# Patient Record
Sex: Male | Born: 1980 | Race: White | Hispanic: No | Marital: Single | Smoking: Never smoker
Health system: Southern US, Community
[De-identification: ages and names within clinical notes are randomized; demographics above are authoritative.]

## PROBLEM LIST (undated history)

## (undated) DIAGNOSIS — S3992XA Unspecified injury of lower back, initial encounter: Secondary | ICD-10-CM

## (undated) DIAGNOSIS — K920 Hematemesis: Secondary | ICD-10-CM

## (undated) DIAGNOSIS — M549 Dorsalgia, unspecified: Secondary | ICD-10-CM

## (undated) DIAGNOSIS — G5603 Carpal tunnel syndrome, bilateral upper limbs: Secondary | ICD-10-CM

## (undated) HISTORY — PX: LINGUAL FRENECTOMY: SHX6357

## (undated) HISTORY — DX: Carpal tunnel syndrome, bilateral upper limbs: G56.03

## (undated) HISTORY — DX: Hematemesis: K92.0

## (undated) HISTORY — DX: Unspecified injury of lower back, initial encounter: S39.92XA

---

## 2003-01-22 ENCOUNTER — Emergency Department (HOSPITAL_COMMUNITY): Admission: EM | Admit: 2003-01-22 | Discharge: 2003-01-23 | Payer: Self-pay | Admitting: *Deleted

## 2003-01-22 ENCOUNTER — Encounter: Payer: Self-pay | Admitting: *Deleted

## 2003-05-20 ENCOUNTER — Encounter: Payer: Self-pay | Admitting: Emergency Medicine

## 2003-05-20 ENCOUNTER — Emergency Department (HOSPITAL_COMMUNITY): Admission: EM | Admit: 2003-05-20 | Discharge: 2003-05-20 | Payer: Self-pay | Admitting: Emergency Medicine

## 2003-05-22 ENCOUNTER — Emergency Department (HOSPITAL_COMMUNITY): Admission: EM | Admit: 2003-05-22 | Discharge: 2003-05-22 | Payer: Self-pay | Admitting: Emergency Medicine

## 2004-06-24 ENCOUNTER — Ambulatory Visit (HOSPITAL_COMMUNITY): Admission: RE | Admit: 2004-06-24 | Discharge: 2004-06-24 | Payer: Self-pay | Admitting: Family Medicine

## 2008-02-27 ENCOUNTER — Emergency Department (HOSPITAL_COMMUNITY): Admission: EM | Admit: 2008-02-27 | Discharge: 2008-02-27 | Payer: Self-pay | Admitting: Emergency Medicine

## 2008-11-26 ENCOUNTER — Emergency Department (HOSPITAL_COMMUNITY): Admission: EM | Admit: 2008-11-26 | Discharge: 2008-11-26 | Payer: Self-pay | Admitting: Emergency Medicine

## 2009-07-09 ENCOUNTER — Emergency Department (HOSPITAL_COMMUNITY): Admission: EM | Admit: 2009-07-09 | Discharge: 2009-07-09 | Payer: Self-pay | Admitting: Emergency Medicine

## 2010-09-29 DIAGNOSIS — G5603 Carpal tunnel syndrome, bilateral upper limbs: Secondary | ICD-10-CM

## 2010-09-29 HISTORY — DX: Carpal tunnel syndrome, bilateral upper limbs: G56.03

## 2011-03-12 ENCOUNTER — Emergency Department (HOSPITAL_COMMUNITY)
Admission: EM | Admit: 2011-03-12 | Discharge: 2011-03-12 | Disposition: A | Discharge status: Home / Self Care | Payer: BC Managed Care – PPO | Attending: Emergency Medicine | Admitting: Emergency Medicine

## 2011-03-12 DIAGNOSIS — G5600 Carpal tunnel syndrome, unspecified upper limb: Secondary | ICD-10-CM | POA: Insufficient documentation

## 2011-03-12 DIAGNOSIS — M25539 Pain in unspecified wrist: Secondary | ICD-10-CM | POA: Insufficient documentation

## 2011-03-30 ENCOUNTER — Emergency Department (HOSPITAL_COMMUNITY)
Admission: EM | Admit: 2011-03-30 | Discharge: 2011-03-30 | Disposition: A | Discharge status: Home / Self Care | Payer: BC Managed Care – PPO | Attending: Emergency Medicine | Admitting: Emergency Medicine

## 2011-03-30 DIAGNOSIS — K089 Disorder of teeth and supporting structures, unspecified: Secondary | ICD-10-CM | POA: Insufficient documentation

## 2012-06-24 ENCOUNTER — Encounter (HOSPITAL_COMMUNITY): Payer: Self-pay | Admitting: Emergency Medicine

## 2012-06-24 ENCOUNTER — Emergency Department (HOSPITAL_COMMUNITY)
Admission: EM | Admit: 2012-06-24 | Discharge: 2012-06-24 | Disposition: A | Discharge status: Home / Self Care | Payer: Self-pay | Attending: Emergency Medicine | Admitting: Emergency Medicine

## 2012-06-24 DIAGNOSIS — F17200 Nicotine dependence, unspecified, uncomplicated: Secondary | ICD-10-CM | POA: Insufficient documentation

## 2012-06-24 DIAGNOSIS — J029 Acute pharyngitis, unspecified: Secondary | ICD-10-CM | POA: Insufficient documentation

## 2012-06-24 LAB — RAPID STREP SCREEN (MED CTR MEBANE ONLY): Streptococcus, Group A Screen (Direct): NEGATIVE

## 2012-06-24 MED ORDER — PENICILLIN V POTASSIUM 500 MG PO TABS: 500.0000 mg | ORAL_TABLET | Freq: Four times a day (QID) | ORAL | Status: AC

## 2012-06-24 NOTE — ED Notes (Signed)
dischagre instructions reviewed

## 2012-06-24 NOTE — ED Notes (Signed)
Pt c/o sore throat since Monday.

## 2012-06-24 NOTE — ED Provider Notes (Signed)
History     CSN: [id number]  Arrival date & time 06/24/12  0901   First MD Initiated Contact with Patient 06/24/12 [phone]      Chief Complaint  Patient presents with  . Sore Throat    (Consider location/radiation/quality/duration/timing/severity/associated sxs/prior treatment) HPI Comments: + subjective fever.  + chills.  Patient is a 31 y.o. male presenting with pharyngitis. The history is provided by the patient. No language interpreter was used.  Sore Throat This is a new problem. Episode onset: 3 days ago. The problem occurs constantly. Associated symptoms include a fever, headaches, myalgias, a sore throat and swollen glands. Pertinent negatives include no chills or coughing. Treatments tried: "cold medicines". The treatment provided no relief.    History reviewed. No pertinent past medical history.  History reviewed. No pertinent past surgical history.  No family history on file.  History  Substance Use Topics  . Smoking status: Current Every Day Smoker    Types: Cigarettes  . Smokeless tobacco: Not on file  . Alcohol Use: Yes      Review of Systems  Constitutional: Positive for fever. Negative for chills.  HENT: Positive for sore throat.   Respiratory: Negative for cough.   Musculoskeletal: Positive for myalgias.  Neurological: Positive for headaches.  All other systems reviewed and are negative.    Allergies  Erythromycin  Home Medications   Current Outpatient Rx  Name Route Sig Dispense Refill  . PENICILLIN V POTASSIUM 500 MG PO TABS Oral Take 1 tablet (500 mg total) by mouth 4 (four) times daily. 40 tablet 0    BP 156/98  Pulse 97  Temp 99 F (37.2 C) (Oral)  Resp 18  SpO2 98%  Physical Exam  Nursing note and vitals reviewed. Constitutional: He is oriented to person, place, and time. He appears well-developed and well-nourished.  HENT:  Head: Normocephalic and atraumatic. No trismus in the jaw.  Mouth/Throat: Uvula is midline and mucous  membranes are normal. No dental abscesses or uvula swelling. Oropharyngeal exudate and posterior oropharyngeal erythema present. No posterior oropharyngeal edema or tonsillar abscesses.  Eyes: EOM are normal.  Neck: Normal range of motion.  Cardiovascular: Normal rate, regular rhythm, normal heart sounds and intact distal pulses.   Pulmonary/Chest: Effort normal and breath sounds normal. No respiratory distress.  Abdominal: Soft. He exhibits no distension. There is no tenderness.  Musculoskeletal: Normal range of motion.  Lymphadenopathy:    He has cervical adenopathy.       Right cervical: Superficial cervical and deep cervical adenopathy present. No posterior cervical adenopathy present.      Left cervical: Superficial cervical and deep cervical adenopathy present. No posterior cervical adenopathy present.  Neurological: He is alert and oriented to person, place, and time.  Skin: Skin is warm and dry.  Psychiatric: He has a normal mood and affect. Judgment normal.    ED Course  Procedures (including critical care time)   Labs Reviewed  RAPID STREP SCREEN   No results found.   1. Pharyngitis       MDM  rx-pen VK, 40 Ibuprofen Salt water gargles Chloraseptic. F/u with PCP prn        Evalina Field, PA 06/24/12 1000

## 2012-06-26 NOTE — ED Provider Notes (Signed)
Medical screening examination/treatment/procedure(s) were performed by non-physician practitioner and as supervising physician I was immediately available for consultation/collaboration.   Carleene Cooper III, MD 06/26/12 [phone]

## 2012-10-16 ENCOUNTER — Emergency Department (HOSPITAL_COMMUNITY): Payer: Self-pay

## 2012-10-16 ENCOUNTER — Encounter (HOSPITAL_COMMUNITY): Payer: Self-pay | Admitting: *Deleted

## 2012-10-16 ENCOUNTER — Emergency Department (HOSPITAL_COMMUNITY)
Admission: EM | Admit: 2012-10-16 | Discharge: 2012-10-16 | Disposition: A | Discharge status: Home / Self Care | Payer: Self-pay | Attending: Emergency Medicine | Admitting: Emergency Medicine

## 2012-10-16 DIAGNOSIS — M2550 Pain in unspecified joint: Secondary | ICD-10-CM | POA: Insufficient documentation

## 2012-10-16 DIAGNOSIS — M79671 Pain in right foot: Secondary | ICD-10-CM

## 2012-10-16 DIAGNOSIS — M79609 Pain in unspecified limb: Secondary | ICD-10-CM | POA: Insufficient documentation

## 2012-10-16 DIAGNOSIS — F17200 Nicotine dependence, unspecified, uncomplicated: Secondary | ICD-10-CM | POA: Insufficient documentation

## 2012-10-16 MED ORDER — ACETAMINOPHEN 500 MG PO TABS
1000.0000 mg | ORAL_TABLET | Freq: Once | ORAL | Status: AC
  Administered 2012-10-16: 1000 mg via ORAL
  Filled 2012-10-16: qty 2

## 2012-10-16 MED ORDER — IBUPROFEN 800 MG PO TABS
800.0000 mg | ORAL_TABLET | Freq: Once | ORAL | Status: AC
  Administered 2012-10-16: 800 mg via ORAL
  Filled 2012-10-16: qty 1

## 2012-10-16 NOTE — ED Provider Notes (Signed)
Medical screening examination/treatment/procedure(s) were performed by non-physician practitioner and as supervising physician I was immediately available for consultation/collaboration.  Daana Petrasek, MD 10/16/12 2239 

## 2012-10-16 NOTE — ED Notes (Signed)
Pt c/o right foot pain

## 2012-10-16 NOTE — ED Provider Notes (Signed)
History     CSN: [id number]  Arrival date & time 10/16/12  2106   First MD Initiated Contact with Patient 10/16/12 2140      Chief Complaint  Patient presents with  . Foot Pain    (Consider location/radiation/quality/duration/timing/severity/associated sxs/prior treatment) Patient is a 32 y.o. male presenting with lower extremity pain. The history is provided by the patient.  Foot Pain This is a new problem. The current episode started in the past 7 days. The problem occurs constantly. The problem has been unchanged. Associated symptoms include arthralgias. Pertinent negatives include no abdominal pain, chest pain, coughing, joint swelling or neck pain. The symptoms are aggravated by standing, walking and bending. He has tried acetaminophen for the symptoms. The treatment provided no relief.    History reviewed. No pertinent past medical history.  History reviewed. No pertinent past surgical history.  History reviewed. No pertinent family history.  History  Substance Use Topics  . Smoking status: Current Every Day Smoker    Types: Cigarettes  . Smokeless tobacco: Not on file  . Alcohol Use: Yes      Review of Systems  Constitutional: Negative for activity change.       All ROS Neg except as noted in HPI  HENT: Negative for nosebleeds and neck pain.   Eyes: Negative for photophobia and discharge.  Respiratory: Negative for cough, shortness of breath and wheezing.   Cardiovascular: Negative for chest pain and palpitations.  Gastrointestinal: Negative for abdominal pain and blood in stool.  Genitourinary: Negative for dysuria, frequency and hematuria.  Musculoskeletal: Positive for arthralgias. Negative for back pain and joint swelling.  Skin: Negative.   Neurological: Negative for dizziness, seizures and speech difficulty.  Psychiatric/Behavioral: Negative for hallucinations and confusion.    Allergies  Erythromycin  Home Medications  No current outpatient  prescriptions on file.  BP 150/81  Pulse 88  Temp 98.2 F (36.8 C) (Oral)  Resp 20  Ht 5\' 1"  (1.549 m)  Wt 197 lb (89.359 kg)  BMI 37.22 kg/m2  SpO2 100%  Physical Exam  Nursing note and vitals reviewed. Constitutional: He is oriented to person, place, and time. He appears well-developed and well-nourished.  Non-toxic appearance.  HENT:  Head: Normocephalic.  Right Ear: Tympanic membrane and external ear normal.  Left Ear: Tympanic membrane and external ear normal.  Eyes: EOM and lids are normal. Pupils are equal, round, and reactive to light.  Neck: Normal range of motion. Neck supple. Carotid bruit is not present.  Cardiovascular: Normal rate, regular rhythm, normal heart sounds, intact distal pulses and normal pulses.   Pulmonary/Chest: Breath sounds normal. No respiratory distress.  Abdominal: Soft. Bowel sounds are normal. There is no tenderness. There is no guarding.  Musculoskeletal: Normal range of motion.       There is full range of motion of the right hip knee and ankle. The Achilles tendon is intact on the right. The dorsalis pedis and posterior tibial pulses are 2+. There is full range of motion of the toes. There no lesions between the toes. There is soreness over the fourth and fifth metatarsal area. No increased redness or red streaking noted.  Lymphadenopathy:       Head (right side): No submandibular adenopathy present.       Head (left side): No submandibular adenopathy present.    He has no cervical adenopathy.  Neurological: He is alert and oriented to person, place, and time. He has normal strength. No cranial nerve deficit or sensory deficit.  Skin: Skin is warm and dry.  Psychiatric: He has a normal mood and affect. His speech is normal.    ED Course  Procedures (including critical care time)  Labs Reviewed - No data to display Dg Foot Complete Right  10/16/2012  *RADIOLOGY REPORT*  Clinical Data: Right foot pain to the base of the fifth toe and lateral  foot after injury 1 week ago.  RIGHT FOOT COMPLETE - 3+ VIEW  Comparison: None.  Findings: The right foot appears intact. No evidence of acute fracture or subluxation.  No focal bone lesions.  Bone matrix and cortex appear intact.  No abnormal radiopaque densities in the soft tissues.  IMPRESSION: No acute bony abnormalities.   Original Report Authenticated By: Burman Nieves, M.D.    Pulse oximetry 100% on room air. Within normal limits by my interpretation.  1. Foot pain, right       MDM  I have reviewed nursing notes, vital signs, and all appropriate lab and imaging results for this patient. Patient has a history of a metatarsal head fractures several years ago. Recently he has been standing. Flexing the toes on the foot, and stepping on and off step ladders while moving. He presents tonight to find out if he has been some injury to the previous fracture or sustained a new fracture.  The x-ray of the foot is negative for fracture or dislocation. There is no gas or free air noted. The examination reveals some soreness over the fourth and fifth metatarsal area but no other changes appreciated. The plan at this time is for the patient to use ibuprofen every 6 hours, he may use Tylenol Extra Strength in between the 6 hour.Marland Kitchen He is to see the podiatrist for additional evaluation if not improving.       Kathie Dike, [place] 10/16/12 2208

## 2013-08-13 ENCOUNTER — Encounter (HOSPITAL_COMMUNITY): Payer: Self-pay | Admitting: Emergency Medicine

## 2013-08-13 ENCOUNTER — Emergency Department (HOSPITAL_COMMUNITY)
Admission: EM | Admit: 2013-08-13 | Discharge: 2013-08-13 | Disposition: A | Discharge status: Home / Self Care | Payer: Self-pay | Attending: Emergency Medicine | Admitting: Emergency Medicine

## 2013-08-13 DIAGNOSIS — M25539 Pain in unspecified wrist: Secondary | ICD-10-CM

## 2013-08-13 DIAGNOSIS — G5600 Carpal tunnel syndrome, unspecified upper limb: Secondary | ICD-10-CM | POA: Insufficient documentation

## 2013-08-13 DIAGNOSIS — F17200 Nicotine dependence, unspecified, uncomplicated: Secondary | ICD-10-CM | POA: Insufficient documentation

## 2013-08-13 MED ORDER — IBUPROFEN 800 MG PO TABS
800.0000 mg | ORAL_TABLET | Freq: Once | ORAL | Status: AC
Start: 2013-08-13 — End: 2013-08-13
  Administered 2013-08-13: 800 mg via ORAL
  Filled 2013-08-13: qty 1

## 2013-08-13 MED ORDER — METHYLPREDNISOLONE 4 MG PO KIT: PACK | ORAL | Status: DC

## 2013-08-13 MED ORDER — HYDROMORPHONE HCL PF 1 MG/ML IJ SOLN
1.0000 mg | Freq: Once | INTRAMUSCULAR | Status: AC
  Administered 2013-08-13: 1 mg via INTRAMUSCULAR
  Filled 2013-08-13: qty 1

## 2013-08-13 NOTE — ED Notes (Signed)
Both hands and wrists hurt, I have carpal tunnel and tendonitis in both and I don't know if I have just irritated them or not per pt.

## 2013-08-13 NOTE — ED Provider Notes (Signed)
CSN: [id number]     Arrival date & time 08/13/13  0100 History   First MD Initiated Contact with Patient 08/13/13 0229     Chief Complaint  Patient presents with  . Hand Pain  . Wrist Pain   (Consider location/radiation/quality/duration/timing/severity/associated sxs/prior Treatment) HPI History provided by patient. 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 He has a history of carpal tunnel previously followed by orthopedics. He has a history of steroid injections that helped for a few months. He had been doing well until starting this new job. He presents tonight with few days of increasing wrist pain worse with movement. No swelling. Has some intermittent tingling denies any weakness. Tonight pain associated he couldn't sleep, requesting narcotics.   Past Medical History  Diagnosis Date  . Carpal tunnel syndrome on both sides    History reviewed. No pertinent past surgical history. History reviewed. No pertinent family history. History  Substance Use Topics  . Smoking status: Current Every Day Smoker    Types: Cigarettes  . Smokeless tobacco: Not on file  . Alcohol Use: Yes    Review of Systems  Constitutional: Negative for fever and chills.  Respiratory: Negative for shortness of breath.   Cardiovascular: Negative for chest pain.  Gastrointestinal: Negative for abdominal pain.  Musculoskeletal: Negative for back pain, neck pain and neck stiffness.  Skin: Negative for rash.  Neurological: Negative for weakness.  All other systems reviewed and are negative.    Allergies  Erythromycin  Home Medications  No current outpatient prescriptions on file. BP 127/72  Pulse 67  Temp(Src) 98.7 F (37.1 C) (Oral)  Resp 20  Ht 5\' 6"  (1.676 m)  Wt 200 lb (90.719 kg)  BMI 32.30 kg/m2  SpO2 98% Physical Exam  Constitutional: He is oriented to person, place, and time. He appears well-developed and well-nourished.  HENT:  Head: Normocephalic and atraumatic.  Eyes: EOM are  normal. Pupils are equal, round, and reactive to light.  Neck: Neck supple.  Cardiovascular: Regular rhythm and intact distal pulses.   Pulmonary/Chest: Effort normal. No respiratory distress.  Musculoskeletal: Normal range of motion. He exhibits no edema.  Positive Tinel's bilaterally. Wrist without swelling or erythema or increased warmth to touch. Good range of motion throughout the hands and wrists. Distal neurovascular intact. Equal grips, triceps, biceps and motor strength and hands/ digits  Neurological: He is alert and oriented to person, place, and time.  Skin: Skin is warm and dry.    ED Course  Procedures (including critical care time) Labs Review Labs Reviewed - No data to display Imaging Review No results found.  EKG Interpretation   None      Motrin, dilaudid, splints provided, plan f/u Ortho  B Hand pain h/o carpal tunnel - patient reports her discharge home and outpatient followup. Prescription provided. Return precautions verbalized as understood  MDM  Diagnosis: Carpal tunnel syndrome  Medications provided Sling provided Orthopedic referral   Sunnie Nielsen, MD 08/14/13 [phone]

## 2013-09-29 DIAGNOSIS — S3992XA Unspecified injury of lower back, initial encounter: Secondary | ICD-10-CM

## 2013-09-29 HISTORY — DX: Unspecified injury of lower back, initial encounter: S39.92XA

## 2013-10-02 ENCOUNTER — Encounter (HOSPITAL_COMMUNITY): Payer: Self-pay | Admitting: Emergency Medicine

## 2013-10-02 ENCOUNTER — Emergency Department (HOSPITAL_COMMUNITY): Payer: Self-pay

## 2013-10-02 ENCOUNTER — Emergency Department (HOSPITAL_COMMUNITY)
Admission: EM | Admit: 2013-10-02 | Discharge: 2013-10-02 | Disposition: A | Discharge status: Home / Self Care | Payer: Self-pay | Attending: Emergency Medicine | Admitting: Emergency Medicine

## 2013-10-02 DIAGNOSIS — R61 Generalized hyperhidrosis: Secondary | ICD-10-CM | POA: Insufficient documentation

## 2013-10-02 DIAGNOSIS — R0789 Other chest pain: Secondary | ICD-10-CM

## 2013-10-02 DIAGNOSIS — R0602 Shortness of breath: Secondary | ICD-10-CM | POA: Insufficient documentation

## 2013-10-02 DIAGNOSIS — Z8669 Personal history of other diseases of the nervous system and sense organs: Secondary | ICD-10-CM | POA: Insufficient documentation

## 2013-10-02 DIAGNOSIS — R071 Chest pain on breathing: Secondary | ICD-10-CM | POA: Insufficient documentation

## 2013-10-02 LAB — CBC
HEMATOCRIT: 43.3 % (ref 39.0–52.0)
HEMOGLOBIN: 15 g/dL (ref 13.0–17.0)
MCH: 29.2 pg (ref 26.0–34.0)
MCHC: 34.6 g/dL (ref 30.0–36.0)
MCV: 84.4 fL (ref 78.0–100.0)
Platelets: 214 10*3/uL (ref 150–400)
RBC: 5.13 MIL/uL (ref 4.22–5.81)
RDW: 12.4 % (ref 11.5–15.5)
WBC: 5.6 10*3/uL (ref 4.0–10.5)

## 2013-10-02 LAB — BASIC METABOLIC PANEL
BUN: 10 mg/dL (ref 6–23)
CHLORIDE: 104 meq/L (ref 96–112)
CO2: 28 mEq/L (ref 19–32)
Calcium: 9.2 mg/dL (ref 8.4–10.5)
Creatinine, Ser: 0.92 mg/dL (ref 0.50–1.35)
GFR calc non Af Amer: >90 mL/min (ref >90)
Glucose, Bld: 108 mg/dL — ABNORMAL HIGH (ref 70–99)
POTASSIUM: 4.4 meq/L (ref 3.7–5.3)
Sodium: 141 mEq/L (ref 137–147)

## 2013-10-02 LAB — D-DIMER, QUANTITATIVE: D-Dimer, Quant: <0.27 ug/mL-FEU (ref 0.00–0.48)

## 2013-10-02 LAB — TROPONIN I: Troponin I: <0.3 ng/mL (ref <0.30)

## 2013-10-02 MED ORDER — OXYCODONE-ACETAMINOPHEN 5-325 MG PO TABS
1.0000 | ORAL_TABLET | Freq: Once | ORAL | Status: AC
  Administered 2013-10-02: 1 via ORAL
  Filled 2013-10-02: qty 1

## 2013-10-02 MED ORDER — OXYCODONE-ACETAMINOPHEN 5-325 MG PO TABS: 1.0000 | ORAL_TABLET | Freq: Four times a day (QID) | ORAL | Status: DC | PRN

## 2013-10-02 NOTE — ED Provider Notes (Signed)
CSN: [id number]     Arrival date & time 10/02/13  1210 History  This chart was scribed for [employer]. Rubin Payor, MD by Bennett Scrape, ED Scribe. This patient was seen in room APA11/[facility]11 and the patient's care was started at 2:38 PM.   Chief Complaint  Patient presents with  . Chest Pain    The history is provided by the patient. No language interpreter was used.    HPI Comments: Joel Benton is a 33 y.o. male who presents to the Emergency Department complaining of sudden onset right-sided CP located "beneath my breast" that he noted today while at rest. He states that the pain was constant with the original onset for one hour but states that now it is an intermittent stabbing pain felt with deep breathing. He denies any recent traumas or falls. He states that he did experience diaphoresis and SOB during the episode which caused him concern but those symptoms have both resolved. He states that he has experienced prior episodes as a child but none this severe. He reports that he did notice left-sided leg swelling that was present 2 to 3 days ago but reports that it resolved. There is a family h/o DVT with his mother and grandmother. He denies having a h/o CA or being a smoker.   Past Medical History  Diagnosis Date  . Carpal tunnel syndrome on both sides    History reviewed. No pertinent past surgical history. No family history on file. History  Substance Use Topics  . Smoking status: Never Smoker   . Smokeless tobacco: Not on file  . Alcohol Use: Yes     Comment: Occ    Review of Systems  Constitutional: Positive for diaphoresis.  Respiratory: Positive for shortness of breath.   Cardiovascular: Positive for chest pain.  All other systems reviewed and are negative.    Allergies  Erythromycin  Home Medications   Current Outpatient Rx  Name  Route  Sig  Dispense  Refill  . oxyCODONE-acetaminophen (PERCOCET/ROXICET) 5-325 MG per tablet   Oral   Take 1-2 tablets by mouth every  6 (six) hours as needed for severe pain.   10 tablet   0    Triage Vitals: BP 122/69  Pulse 70  Temp(Src) 98.8 F (37.1 C) (Oral)  Resp 24  Ht 5\' 6"  (1.676 m)  Wt 197 lb (89.359 kg)  BMI 31.81 kg/m2  SpO2 100%  Physical Exam  Nursing note and vitals reviewed. Constitutional: He is oriented to person, place, and time. He appears well-developed and well-nourished. No distress.  HENT:  Head: Normocephalic and atraumatic.  Eyes: EOM are normal.  Neck: Neck supple. No tracheal deviation present.  Cardiovascular: Normal rate and regular rhythm.   Pulmonary/Chest: Effort normal and breath sounds normal. No respiratory distress. He exhibits tenderness (right anterior mid to lateral chest wall).  Abdominal: Soft. There is no tenderness.  Musculoskeletal: Normal range of motion. He exhibits no edema (no peripheral edema ).  Neurological: He is alert and oriented to person, place, and time.  Skin: Skin is warm and dry.  Psychiatric: He has a normal mood and affect. His behavior is normal.    ED Course  Procedures (including critical care time)  DIAGNOSTIC STUDIES: Oxygen Saturation is 100% on RA, normal by my interpretation.    COORDINATION OF CARE: 2:41 PM-Informed pt that work up thus far is less concerning for cardiac issues. Discussed treatment plan which includes D-dimer with pt at bedside and pt agreed to  plan.   Labs Review Labs Reviewed  BASIC METABOLIC PANEL - Abnormal; Notable for the following:    Glucose, Bld 108 (*)    All other components within normal limits  CBC  TROPONIN I  D-DIMER, QUANTITATIVE   Imaging Review Dg Chest 2 View  10/02/2013   CLINICAL DATA:  Substernal chest pain 1 hr. Nausea and shortness of breath.  EXAM: CHEST  2 VIEW  COMPARISON:  06/22/2004  FINDINGS: Lungs are hypoinflated without consolidation or effusion. The cardiomediastinal silhouette and remainder of the exam is unchanged.  IMPRESSION: No active cardiopulmonary disease.    Electronically Signed   By: Elberta Fortisaniel  Boyle M.D.   On: 10/02/2013 12:47    EKG Interpretation    Date/Time:  Sunday October 02 2013 12:15:49 EST Ventricular Rate:  78 PR Interval:  142 QRS Duration: 104 QT Interval:  374 QTC Calculation: 426 R Axis:   27 Text Interpretation:  Normal sinus rhythm Normal ECG No previous ECGs available Confirmed by Kadesia Robel  MD, Jance Siek ([id number]) on 10/02/2013 2:47:23 PM            MDM   1. Chest wall pain    Patient with acute onset right chest pain. he's point tender on the anterior chest. EKG reassuring.Negative x-ray. Doubt cardiac cause. Patient's mother and grandmother have both had DVTs. Negative d-dimer for the patient. Will discharge home with pain medicines  I personally performed the services described in this documentation, which was scribed in my presence. The recorded information has been reviewed and is accurate.     Juliet RudeNathan R. Rubin PayorPickering, MD 10/02/13 [phone]

## 2013-10-02 NOTE — ED Notes (Signed)
Pt states right lower chest pain began 1 hour PTA. States "It was hard to breathe for a few minutes" Also states nausea. NAD at this time. Only complaint at this time is pain to right chest.

## 2013-10-02 NOTE — Discharge Instructions (Signed)

## 2014-08-14 ENCOUNTER — Encounter (HOSPITAL_COMMUNITY): Payer: Self-pay | Admitting: *Deleted

## 2014-08-14 ENCOUNTER — Emergency Department (HOSPITAL_COMMUNITY): Payer: Self-pay

## 2014-08-14 ENCOUNTER — Emergency Department (HOSPITAL_COMMUNITY)
Admission: EM | Admit: 2014-08-14 | Discharge: 2014-08-14 | Disposition: A | Discharge status: Home / Self Care | Payer: Self-pay | Attending: Emergency Medicine | Admitting: Emergency Medicine

## 2014-08-14 DIAGNOSIS — Y929 Unspecified place or not applicable: Secondary | ICD-10-CM | POA: Insufficient documentation

## 2014-08-14 DIAGNOSIS — S3992XA Unspecified injury of lower back, initial encounter: Secondary | ICD-10-CM | POA: Insufficient documentation

## 2014-08-14 DIAGNOSIS — Z8669 Personal history of other diseases of the nervous system and sense organs: Secondary | ICD-10-CM | POA: Insufficient documentation

## 2014-08-14 DIAGNOSIS — K2100 Gastro-esophageal reflux disease with esophagitis, without bleeding: Secondary | ICD-10-CM

## 2014-08-14 DIAGNOSIS — M545 Low back pain, unspecified: Secondary | ICD-10-CM

## 2014-08-14 DIAGNOSIS — W19XXXA Unspecified fall, initial encounter: Secondary | ICD-10-CM

## 2014-08-14 DIAGNOSIS — W1839XA Other fall on same level, initial encounter: Secondary | ICD-10-CM | POA: Insufficient documentation

## 2014-08-14 DIAGNOSIS — Y998 Other external cause status: Secondary | ICD-10-CM | POA: Insufficient documentation

## 2014-08-14 DIAGNOSIS — Y939 Activity, unspecified: Secondary | ICD-10-CM | POA: Insufficient documentation

## 2014-08-14 LAB — CBC WITH DIFFERENTIAL/PLATELET
BASOS ABS: 0 10*3/uL (ref 0.0–0.1)
Basophils Relative: 0 % (ref 0–1)
Eosinophils Absolute: 0.1 10*3/uL (ref 0.0–0.7)
Eosinophils Relative: 2 % (ref 0–5)
HEMATOCRIT: 45 % (ref 39.0–52.0)
Hemoglobin: 15.6 g/dL (ref 13.0–17.0)
LYMPHS PCT: 24 % (ref 12–46)
Lymphs Abs: 1.4 10*3/uL (ref 0.7–4.0)
MCH: 29.7 pg (ref 26.0–34.0)
MCHC: 34.7 g/dL (ref 30.0–36.0)
MCV: 85.7 fL (ref 78.0–100.0)
Monocytes Absolute: 0.4 10*3/uL (ref 0.1–1.0)
Monocytes Relative: 7 % (ref 3–12)
NEUTROS ABS: 4 10*3/uL (ref 1.7–7.7)
NEUTROS PCT: 67 % (ref 43–77)
PLATELETS: 235 10*3/uL (ref 150–400)
RBC: 5.25 MIL/uL (ref 4.22–5.81)
RDW: 13 % (ref 11.5–15.5)
WBC: 5.9 10*3/uL (ref 4.0–10.5)

## 2014-08-14 LAB — URINALYSIS, ROUTINE W REFLEX MICROSCOPIC
Bilirubin Urine: NEGATIVE
GLUCOSE, UA: NEGATIVE mg/dL
Hgb urine dipstick: NEGATIVE
Ketones, ur: NEGATIVE mg/dL
LEUKOCYTES UA: NEGATIVE
NITRITE: NEGATIVE
PROTEIN: NEGATIVE mg/dL
Specific Gravity, Urine: 1.015 (ref 1.005–1.030)
UROBILINOGEN UA: 0.2 mg/dL (ref 0.0–1.0)
pH: 7 (ref 5.0–8.0)

## 2014-08-14 LAB — COMPREHENSIVE METABOLIC PANEL
ALT: 53 U/L (ref 0–53)
AST: 30 U/L (ref 0–37)
Albumin: 4.4 g/dL (ref 3.5–5.2)
Alkaline Phosphatase: 60 U/L (ref 39–117)
Anion gap: 10 (ref 5–15)
BILIRUBIN TOTAL: 0.8 mg/dL (ref 0.3–1.2)
BUN: 9 mg/dL (ref 6–23)
CHLORIDE: 101 meq/L (ref 96–112)
CO2: 29 meq/L (ref 19–32)
Calcium: 9.4 mg/dL (ref 8.4–10.5)
Creatinine, Ser: 1.1 mg/dL (ref 0.50–1.35)
GFR calc Af Amer: >90 mL/min (ref >90)
GFR, EST NON AFRICAN AMERICAN: 88 mL/min — AB (ref >90)
Glucose, Bld: 90 mg/dL (ref 70–99)
POTASSIUM: 4.1 meq/L (ref 3.7–5.3)
SODIUM: 140 meq/L (ref 137–147)
Total Protein: 7.4 g/dL (ref 6.0–8.3)

## 2014-08-14 MED ORDER — PANTOPRAZOLE SODIUM 40 MG PO TBEC
40.0000 mg | DELAYED_RELEASE_TABLET | Freq: Once | ORAL | Status: AC
  Administered 2014-08-14: 40 mg via ORAL
  Filled 2014-08-14: qty 1

## 2014-08-14 MED ORDER — OXYCODONE-ACETAMINOPHEN 5-325 MG PO TABS: 1.0000 | ORAL_TABLET | ORAL | Status: DC | PRN

## 2014-08-14 MED ORDER — OXYCODONE-ACETAMINOPHEN 5-325 MG PO TABS
1.0000 | ORAL_TABLET | Freq: Once | ORAL | Status: AC
  Administered 2014-08-14: 1 via ORAL
  Filled 2014-08-14: qty 1

## 2014-08-14 MED ORDER — OXYCODONE-ACETAMINOPHEN 5-325 MG PO TABS: ORAL_TABLET | ORAL | Status: DC

## 2014-08-14 MED ORDER — CYCLOBENZAPRINE HCL 10 MG PO TABS: 10.0000 mg | ORAL_TABLET | Freq: Three times a day (TID) | ORAL | Status: DC | PRN

## 2014-08-14 NOTE — ED Notes (Signed)
Pt has past hx of back injury and pain started back 8 days ago. Pain is in lower back but moves up pts spine. Pain is worse with movement.
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
## 2014-08-14 NOTE — ED Notes (Signed)
Pre pack of percocet given to pt   1 q4h

## 2014-08-14 NOTE — ED Notes (Signed)
Back pain for 8 days, Has hx of back pain, No relief with flexeril and tylenol.

## 2014-08-22 MED FILL — Oxycodone w/ Acetaminophen Tab 5-325 MG: ORAL | Qty: 6 | Status: AC

## 2014-10-09 ENCOUNTER — Emergency Department (HOSPITAL_COMMUNITY)
Admission: EM | Admit: 2014-10-09 | Discharge: 2014-10-10 | Disposition: A | Discharge status: Home / Self Care | Payer: Self-pay | Attending: Emergency Medicine | Admitting: Emergency Medicine

## 2014-10-09 ENCOUNTER — Encounter (HOSPITAL_COMMUNITY): Payer: Self-pay | Admitting: *Deleted

## 2014-10-09 DIAGNOSIS — Z8669 Personal history of other diseases of the nervous system and sense organs: Secondary | ICD-10-CM | POA: Insufficient documentation

## 2014-10-09 DIAGNOSIS — M545 Low back pain, unspecified: Secondary | ICD-10-CM

## 2014-10-09 DIAGNOSIS — Z87828 Personal history of other (healed) physical injury and trauma: Secondary | ICD-10-CM | POA: Insufficient documentation

## 2014-10-09 HISTORY — DX: Dorsalgia, unspecified: M54.9

## 2014-10-09 MED ORDER — CYCLOBENZAPRINE HCL 10 MG PO TABS
10.0000 mg | ORAL_TABLET | Freq: Once | ORAL | Status: AC
  Administered 2014-10-10: 10 mg via ORAL
  Filled 2014-10-09: qty 1

## 2014-10-09 MED ORDER — OXYCODONE-ACETAMINOPHEN 5-325 MG PO TABS
1.0000 | ORAL_TABLET | Freq: Once | ORAL | Status: AC
  Administered 2014-10-10: 1 via ORAL
  Filled 2014-10-09: qty 1

## 2014-10-09 MED ORDER — KETOROLAC TROMETHAMINE 60 MG/2ML IM SOLN
60.0000 mg | Freq: Once | INTRAMUSCULAR | Status: AC
  Administered 2014-10-10: 60 mg via INTRAMUSCULAR
  Filled 2014-10-09: qty 2

## 2014-10-09 NOTE — ED Notes (Signed)
Low back pain , for 4 days.
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
## 2014-10-09 NOTE — ED Notes (Signed)
Pt reports moving some wood & dog house about 4 days ago & aggravated his lower back again. Pt ambulated down hallway w/ no complications

## 2014-10-10 MED ORDER — IBUPROFEN 600 MG PO TABS: 600.0000 mg | ORAL_TABLET | Freq: Three times a day (TID) | ORAL | Status: DC | PRN

## 2014-10-10 MED ORDER — CYCLOBENZAPRINE HCL 10 MG PO TABS: 10.0000 mg | ORAL_TABLET | Freq: Three times a day (TID) | ORAL | Status: DC | PRN

## 2014-10-10 NOTE — ED Notes (Signed)
Pt alert & oriented x4, stable gait. Patient given discharge instructions, paperwork & prescription(s). Patient  instructed to stop at the registration desk to finish any additional paperwork. Patient verbalized understanding. Pt left department w/ no further questions. 

## 2015-01-29 ENCOUNTER — Encounter (HOSPITAL_COMMUNITY): Payer: Self-pay

## 2015-01-29 ENCOUNTER — Emergency Department (HOSPITAL_COMMUNITY)
Admission: EM | Admit: 2015-01-29 | Discharge: 2015-01-29 | Disposition: A | Discharge status: Home / Self Care | Payer: Self-pay | Attending: Emergency Medicine | Admitting: Emergency Medicine

## 2015-01-29 DIAGNOSIS — M545 Low back pain, unspecified: Secondary | ICD-10-CM

## 2015-01-29 DIAGNOSIS — Z8669 Personal history of other diseases of the nervous system and sense organs: Secondary | ICD-10-CM | POA: Insufficient documentation

## 2015-01-29 MED ORDER — DIAZEPAM 5 MG PO TABS
5.0000 mg | ORAL_TABLET | Freq: Once | ORAL | Status: AC
  Administered 2015-01-29: 5 mg via ORAL
  Filled 2015-01-29: qty 1

## 2015-01-29 MED ORDER — DIAZEPAM 5 MG PO TABS: 5.0000 mg | ORAL_TABLET | Freq: Three times a day (TID) | ORAL | Status: DC | PRN

## 2015-01-29 MED ORDER — PREDNISONE 10 MG PO TABS: ORAL_TABLET | ORAL | Status: DC

## 2015-01-29 MED ORDER — PREDNISONE 50 MG PO TABS
60.0000 mg | ORAL_TABLET | Freq: Once | ORAL | Status: AC
  Administered 2015-01-29: 60 mg via ORAL
  Filled 2015-01-29 (×2): qty 1

## 2015-01-29 MED ORDER — TRAMADOL HCL 50 MG PO TABS: 50.0000 mg | ORAL_TABLET | Freq: Four times a day (QID) | ORAL | Status: DC | PRN

## 2015-01-29 MED ORDER — TRAMADOL HCL 50 MG PO TABS
50.0000 mg | ORAL_TABLET | Freq: Once | ORAL | Status: AC
  Administered 2015-01-29: 50 mg via ORAL
  Filled 2015-01-29: qty 1

## 2015-01-29 NOTE — ED Notes (Signed)
PA Julie at bedside.  

## 2015-01-29 NOTE — Discharge Instructions (Signed)
Chronic Back Pain  When back pain lasts longer than 3 months, it is called chronic back pain.People with chronic back pain often go through certain periods that are more intense (flare-ups).  CAUSES Chronic back pain can be caused by wear and tear (degeneration) on different structures in your back. These structures include:  The bones of your spine (vertebrae) and the joints surrounding your spinal cord and nerve roots (facets).  The strong, fibrous tissues that connect your vertebrae (ligaments). Degeneration of these structures may result in pressure on your nerves. This can lead to constant pain. HOME CARE INSTRUCTIONS  Avoid bending, heavy lifting, prolonged sitting, and activities which make the problem worse.  Take brief periods of rest throughout the day to reduce your pain. Lying down or standing usually is better than sitting while you are resting.  Take over-the-counter or prescription medicines only as directed by your caregiver. SEEK IMMEDIATE MEDICAL CARE IF:   You have weakness or numbness in one of your legs or feet.  You have trouble controlling your bladder or bowels.  You have nausea, vomiting, abdominal pain, shortness of breath, or fainting. Document Released: 10/23/2004 Document Revised: 12/08/2011 Document Reviewed: 08/30/2011 [facility] Patient Information 2015 [employer] [place]. This information is not intended to replace advice given to you by your health care provider. Make sure you discuss any questions you have with your health care provider.   Take your next dose of prednisone tomorrow evening.  Use the the other medicine as directed.  Do not drive within 4 hours of taking valium as this will make you drowsy.  Avoid lifting,  Bending,  Twisting or any other activity that worsens your pain over the next week.  Apply an  icepack  to your lower back for 10-15 minutes every 2 hours for the next 2 days.  You should get rechecked if your symptoms are not better over  the next 5 days,  Or you develop increased pain,  Weakness in your leg(s) or loss of bladder or bowel function - these are symptoms of a worse injury.
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
## 2015-02-05 ENCOUNTER — Emergency Department (HOSPITAL_COMMUNITY)
Admission: EM | Admit: 2015-02-05 | Discharge: 2015-02-05 | Disposition: A | Discharge status: Home / Self Care | Payer: Self-pay | Attending: Emergency Medicine | Admitting: Emergency Medicine

## 2015-02-05 ENCOUNTER — Encounter (HOSPITAL_COMMUNITY): Payer: Self-pay | Admitting: Emergency Medicine

## 2015-02-05 DIAGNOSIS — Z8669 Personal history of other diseases of the nervous system and sense organs: Secondary | ICD-10-CM | POA: Insufficient documentation

## 2015-02-05 DIAGNOSIS — M545 Low back pain: Secondary | ICD-10-CM | POA: Insufficient documentation

## 2015-02-05 NOTE — Discharge Instructions (Signed)
Patient left before discharge information could be given
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
## 2015-06-27 ENCOUNTER — Other Ambulatory Visit: Payer: Self-pay | Admitting: Physician Assistant

## 2015-06-27 LAB — LIPID PANEL
Cholesterol: 202 mg/dL — ABNORMAL HIGH (ref 125–200)
HDL: 33 mg/dL — AB (ref >=40)
LDL Cholesterol: 130 mg/dL — ABNORMAL HIGH (ref <130)
Total CHOL/HDL Ratio: 6.1 Ratio — ABNORMAL HIGH (ref <=5.0)
Triglycerides: 195 mg/dL — ABNORMAL HIGH (ref <150)
VLDL: 39 mg/dL — ABNORMAL HIGH (ref <30)

## 2015-06-27 LAB — COMPREHENSIVE METABOLIC PANEL
ALBUMIN: 4.4 g/dL (ref 3.6–5.1)
ALK PHOS: 54 U/L (ref 40–115)
ALT: 28 U/L (ref 9–46)
AST: 17 U/L (ref 10–40)
BUN: 14 mg/dL (ref 7–25)
CALCIUM: 9.2 mg/dL (ref 8.6–10.3)
CO2: 28 mmol/L (ref 20–31)
CREATININE: 0.93 mg/dL (ref 0.60–1.35)
Chloride: 103 mmol/L (ref 98–110)
Glucose, Bld: 101 mg/dL — ABNORMAL HIGH (ref 65–99)
Potassium: 3.9 mmol/L (ref 3.5–5.3)
SODIUM: 138 mmol/L (ref 135–146)
Total Bilirubin: 1.4 mg/dL — ABNORMAL HIGH (ref 0.2–1.2)
Total Protein: 6.9 g/dL (ref 6.1–8.1)

## 2015-06-27 LAB — CBC WITH DIFFERENTIAL/PLATELET
BASOS ABS: 0.1 10*3/uL (ref 0.0–0.1)
Basophils Relative: 1 % (ref 0–1)
Eosinophils Absolute: 0.1 10*3/uL (ref 0.0–0.7)
Eosinophils Relative: 2 % (ref 0–5)
HEMATOCRIT: 46.3 % (ref 39.0–52.0)
HEMOGLOBIN: 15.9 g/dL (ref 13.0–17.0)
LYMPHS PCT: 29 % (ref 12–46)
Lymphs Abs: 1.9 10*3/uL (ref 0.7–4.0)
MCH: 29.6 pg (ref 26.0–34.0)
MCHC: 34.3 g/dL (ref 30.0–36.0)
MCV: 86.1 fL (ref 78.0–100.0)
MPV: 9.1 fL (ref 8.6–12.4)
Monocytes Absolute: 0.5 10*3/uL (ref 0.1–1.0)
Monocytes Relative: 8 % (ref 3–12)
NEUTROS ABS: 3.9 10*3/uL (ref 1.7–7.7)
Neutrophils Relative %: 60 % (ref 43–77)
Platelets: 217 10*3/uL (ref 150–400)
RBC: 5.38 MIL/uL (ref 4.22–5.81)
RDW: 13.8 % (ref 11.5–15.5)
WBC: 6.5 10*3/uL (ref 4.0–10.5)

## 2015-06-27 LAB — TSH: TSH: 5.163 u[IU]/mL — AB (ref 0.350–4.500)

## 2015-06-28 LAB — HEMOGLOBIN A1C
Hgb A1c MFr Bld: 5.7 % — ABNORMAL HIGH (ref <5.7)
MEAN PLASMA GLUCOSE: 117 mg/dL — AB (ref <117)

## 2015-07-26 ENCOUNTER — Encounter: Payer: Self-pay | Admitting: Physician Assistant

## 2015-07-26 ENCOUNTER — Ambulatory Visit: Payer: Self-pay | Admitting: Physician Assistant

## 2015-07-26 VITALS — BP 138/80 | HR 80 | Temp 97.5°F | Ht 64.0 in | Wt 233.5 lb

## 2015-07-26 DIAGNOSIS — E785 Hyperlipidemia, unspecified: Secondary | ICD-10-CM

## 2015-07-26 DIAGNOSIS — F329 Major depressive disorder, single episode, unspecified: Secondary | ICD-10-CM

## 2015-07-26 DIAGNOSIS — F32A Depression, unspecified: Secondary | ICD-10-CM

## 2015-07-26 DIAGNOSIS — K219 Gastro-esophageal reflux disease without esophagitis: Secondary | ICD-10-CM

## 2015-07-26 MED ORDER — LOVASTATIN 20 MG PO TABS: 20.0000 mg | ORAL_TABLET | Freq: Every day | ORAL | Status: DC

## 2015-07-26 MED ORDER — RANITIDINE HCL 300 MG PO TABS: 300.0000 mg | ORAL_TABLET | Freq: Every day | ORAL | Status: DC

## 2015-07-26 NOTE — Progress Notes (Signed)
BP 138/80 mmHg  Pulse 80  Temp(Src) 97.5 F (36.4 C)  Ht 5' 4"  (1.626 m)  Wt 233 lb 8 oz (105.915 kg)  BMI 40.06 kg/m2  SpO2 97%   Subjective:    Patient ID: Joel Benton, male    DOB: 1981-09-07, 34 y.o.   MRN: [id number]  HPI: Joel Benton is a 34 y.o. male presenting on 07/26/2015 for Depression and Anxiety   HPI   Pt was given card for [facility] at last OV one month ago and he has still not called  Relevant past medical, surgical, family and social history reviewed and updated as indicated. Interim medical history since our last visit reviewed. Allergies and medications reviewed and updated.  Current outpatient prescriptions:  .  acetaminophen (TYLENOL) 500 MG tablet, Take 500 mg by mouth every 6 (six) hours as needed for mild pain or moderate pain., Disp: , Rfl:    Review of Systems  Constitutional: Positive for fatigue. Negative for fever, chills, diaphoresis, appetite change and unexpected weight change.  HENT: Positive for congestion and sneezing. Negative for dental problem, drooling, ear pain, facial swelling, hearing loss, mouth sores, sore throat, trouble swallowing and voice change.   Eyes: Positive for itching. Negative for pain, discharge, redness and visual disturbance.  Respiratory: Positive for cough. Negative for choking, shortness of breath and wheezing.   Cardiovascular: Negative for chest pain, palpitations and leg swelling.  Gastrointestinal: Positive for diarrhea. Negative for vomiting, abdominal pain, constipation and blood in stool.  Endocrine: Negative for cold intolerance, heat intolerance and polydipsia.  Genitourinary: Negative for dysuria, hematuria and decreased urine volume.  Musculoskeletal: Positive for back pain and gait problem. Negative for arthralgias.  Skin: Negative for rash.  Allergic/Immunologic: Positive for environmental allergies.  Neurological: Positive for headaches. Negative for seizures, syncope and light-headedness.   Hematological: Negative for adenopathy.  Psychiatric/Behavioral: Positive for dysphoric mood and agitation. Negative for suicidal ideas. The patient is nervous/anxious.     Per HPI unless specifically indicated above     Objective:    BP 138/80 mmHg  Pulse 80  Temp(Src) 97.5 F (36.4 C)  Ht 5' 4"  (1.626 m)  Wt 233 lb 8 oz (105.915 kg)  BMI 40.06 kg/m2  SpO2 97%  Wt Readings from Last 3 Encounters:  07/26/15 233 lb 8 oz (105.915 kg)  01/29/15 220 lb (99.791 kg)  10/09/14 230 lb (104.327 kg)    phq-9 Gad-7 Physical Exam  Constitutional: He is oriented to person, place, and time. He appears well-developed and well-nourished.  HENT:  Head: Normocephalic and atraumatic.  Neck: Neck supple.  Cardiovascular: Normal rate and regular rhythm.   Pulmonary/Chest: Effort normal and breath sounds normal. He has no wheezes.  Abdominal: Soft. Bowel sounds are normal. There is no tenderness.  Musculoskeletal: He exhibits no edema.  Lymphadenopathy:    He has no cervical adenopathy.  Neurological: He is alert and oriented to person, place, and time.  Skin: Skin is warm and dry.  Psychiatric: He has a normal mood and affect. His behavior is normal.  Vitals reviewed.     Results for orders placed or performed in visit on 06/27/15  CBC with Differential/Platelet  Result Value Ref Range   WBC 6.5 4.0 - 10.5 K/uL   RBC 5.38 4.22 - 5.81 MIL/uL   Hemoglobin 15.9 13.0 - 17.0 g/dL   HCT 46.3 39.0 - 52.0 %   MCV 86.1 78.0 - 100.0 fL   MCH 29.6 26.0 - 34.0 pg  MCHC 34.3 30.0 - 36.0 g/dL   RDW 13.8 11.5 - 15.5 %   Platelets 217 150 - 400 K/uL   MPV 9.1 8.6 - 12.4 fL   Neutrophils Relative % 60 43 - 77 %   Neutro Abs 3.9 1.7 - 7.7 K/uL   Lymphocytes Relative 29 12 - 46 %   Lymphs Abs 1.9 0.7 - 4.0 K/uL   Monocytes Relative 8 3 - 12 %   Monocytes Absolute 0.5 0.1 - 1.0 K/uL   Eosinophils Relative 2 0 - 5 %   Eosinophils Absolute 0.1 0.0 - 0.7 K/uL   Basophils Relative 1 0 - 1 %    Basophils Absolute 0.1 0.0 - 0.1 K/uL   Smear Review Criteria for review not met   Comprehensive metabolic panel  Result Value Ref Range   Sodium 138 135 - 146 mmol/L   Potassium 3.9 3.5 - 5.3 mmol/L   Chloride 103 98 - 110 mmol/L   CO2 28 20 - 31 mmol/L   Glucose, Bld 101 (H) 65 - 99 mg/dL   BUN 14 7 - 25 mg/dL   Creat 0.93 0.60 - 1.35 mg/dL   Total Bilirubin 1.4 (H) 0.2 - 1.2 mg/dL   Alkaline Phosphatase 54 40 - 115 U/L   AST 17 10 - 40 U/L   ALT 28 9 - 46 U/L   Total Protein 6.9 6.1 - 8.1 g/dL   Albumin 4.4 3.6 - 5.1 g/dL   Calcium 9.2 8.6 - 10.3 mg/dL  Lipid panel  Result Value Ref Range   Cholesterol 202 (H) 125 - 200 mg/dL   Triglycerides 195 (H) <150 mg/dL   HDL 33 (L) >=40 mg/dL   Total CHOL/HDL Ratio 6.1 (H) <=5.0 Ratio   VLDL 39 (H) <30 mg/dL   LDL Cholesterol 130 (H) <130 mg/dL  TSH  Result Value Ref Range   TSH 5.163 (H) 0.350 - 4.500 uIU/mL  Hemoglobin A1c  Result Value Ref Range   Hgb A1c MFr Bld 5.7 (H) <5.7 %   Mean Plasma Glucose 117 (H) <117 mg/dL      Assessment & Plan:   Encounter Diagnoses  Name Primary?  . Hyperlipemia Yes  . Gastroesophageal reflux disease, esophagitis presence not specified   . Depression   . Morbid obesity, unspecified obesity type ([place])      -Reviewed labs with pt  -rx lovastatin for chol and gave lowfat diet sheet -rx ranitidine for gerd -urged pt to call [facility] ([facility]) for [place] referral and he said he would -f/u 3 mo. rto sooner prn

## 2015-07-26 NOTE — Patient Instructions (Signed)

## 2015-07-27 DIAGNOSIS — E785 Hyperlipidemia, unspecified: Secondary | ICD-10-CM | POA: Insufficient documentation

## 2015-07-27 DIAGNOSIS — K219 Gastro-esophageal reflux disease without esophagitis: Secondary | ICD-10-CM | POA: Insufficient documentation

## 2015-10-09 ENCOUNTER — Ambulatory Visit: Payer: Self-pay | Admitting: Physician Assistant

## 2015-10-24 ENCOUNTER — Encounter: Payer: Self-pay | Admitting: Physician Assistant

## 2015-12-20 ENCOUNTER — Encounter (HOSPITAL_COMMUNITY): Payer: Self-pay | Admitting: Emergency Medicine

## 2015-12-20 ENCOUNTER — Emergency Department (HOSPITAL_COMMUNITY)
Admission: EM | Admit: 2015-12-20 | Discharge: 2015-12-20 | Disposition: A | Discharge status: Home / Self Care | Payer: Self-pay | Attending: Emergency Medicine | Admitting: Emergency Medicine

## 2015-12-20 DIAGNOSIS — L0201 Cutaneous abscess of face: Secondary | ICD-10-CM | POA: Insufficient documentation

## 2015-12-20 DIAGNOSIS — R51 Headache: Secondary | ICD-10-CM | POA: Insufficient documentation

## 2015-12-20 DIAGNOSIS — Z79899 Other long term (current) drug therapy: Secondary | ICD-10-CM | POA: Insufficient documentation

## 2015-12-20 DIAGNOSIS — M542 Cervicalgia: Secondary | ICD-10-CM | POA: Insufficient documentation

## 2015-12-20 DIAGNOSIS — Z7982 Long term (current) use of aspirin: Secondary | ICD-10-CM | POA: Insufficient documentation

## 2015-12-20 LAB — BASIC METABOLIC PANEL
Anion gap: 8 (ref 5–15)
BUN: 13 mg/dL (ref 6–20)
CALCIUM: 8.7 mg/dL — AB (ref 8.9–10.3)
CO2: 30 mmol/L (ref 22–32)
Chloride: 101 mmol/L (ref 101–111)
Creatinine, Ser: 0.96 mg/dL (ref 0.61–1.24)
GFR calc Af Amer: >60 mL/min (ref >60)
GFR calc non Af Amer: >60 mL/min (ref >60)
GLUCOSE: 102 mg/dL — AB (ref 65–99)
POTASSIUM: 3.9 mmol/L (ref 3.5–5.1)
Sodium: 139 mmol/L (ref 135–145)

## 2015-12-20 LAB — CBC
HCT: 42.2 % (ref 39.0–52.0)
Hemoglobin: 14.3 g/dL (ref 13.0–17.0)
MCH: 29.7 pg (ref 26.0–34.0)
MCHC: 33.9 g/dL (ref 30.0–36.0)
MCV: 87.6 fL (ref 78.0–100.0)
PLATELETS: 228 10*3/uL (ref 150–400)
RBC: 4.82 MIL/uL (ref 4.22–5.81)
RDW: 12.9 % (ref 11.5–15.5)
WBC: 11.8 10*3/uL — ABNORMAL HIGH (ref 4.0–10.5)

## 2015-12-20 MED ORDER — SODIUM CHLORIDE 0.9 % IV SOLN
INTRAVENOUS | Status: DC
  Administered 2015-12-20: 15:00:00 via INTRAVENOUS

## 2015-12-20 MED ORDER — LIDOCAINE-EPINEPHRINE (PF) 2 %-1:200000 IJ SOLN
10.0000 mL | Freq: Once | INTRAMUSCULAR | Status: AC
  Administered 2015-12-20: 10 mL
  Filled 2015-12-20: qty 20

## 2015-12-20 MED ORDER — MUPIROCIN CALCIUM 2 % NA OINT: 1.0000 "application " | TOPICAL_OINTMENT | Freq: Two times a day (BID) | NASAL | Status: DC

## 2015-12-20 MED ORDER — ONDANSETRON HCL 4 MG/2ML IJ SOLN
4.0000 mg | Freq: Once | INTRAMUSCULAR | Status: AC
  Administered 2015-12-20: 4 mg via INTRAVENOUS
  Filled 2015-12-20: qty 2

## 2015-12-20 MED ORDER — MORPHINE SULFATE (PF) 4 MG/ML IV SOLN
4.0000 mg | Freq: Once | INTRAVENOUS | Status: AC
  Administered 2015-12-20: 4 mg via INTRAVENOUS
  Filled 2015-12-20: qty 1

## 2015-12-20 MED ORDER — KETOROLAC TROMETHAMINE 30 MG/ML IJ SOLN
30.0000 mg | Freq: Once | INTRAMUSCULAR | Status: AC
  Administered 2015-12-20: 30 mg via INTRAVENOUS
  Filled 2015-12-20: qty 1

## 2015-12-20 MED ORDER — HYDROMORPHONE HCL 1 MG/ML IJ SOLN
1.0000 mg | Freq: Once | INTRAMUSCULAR | Status: AC
  Administered 2015-12-20: 1 mg via INTRAVENOUS
  Filled 2015-12-20: qty 1

## 2015-12-20 MED ORDER — CLINDAMYCIN HCL 150 MG PO CAPS: 150.0000 mg | ORAL_CAPSULE | Freq: Four times a day (QID) | ORAL | Status: DC

## 2015-12-20 MED ORDER — LORAZEPAM 2 MG/ML IJ SOLN
1.0000 mg | Freq: Once | INTRAMUSCULAR | Status: AC
  Administered 2015-12-20: 1 mg via INTRAVENOUS
  Filled 2015-12-20: qty 1

## 2015-12-20 MED ORDER — HYDROCODONE-ACETAMINOPHEN 5-325 MG PO TABS: 1.0000 | ORAL_TABLET | ORAL | Status: DC | PRN

## 2015-12-20 MED ORDER — SODIUM CHLORIDE 0.9 % IV BOLUS (SEPSIS)
1000.0000 mL | Freq: Once | INTRAVENOUS | Status: AC
  Administered 2015-12-20: 1000 mL via INTRAVENOUS

## 2015-12-20 MED ORDER — CLINDAMYCIN HCL 150 MG PO CAPS
300.0000 mg | ORAL_CAPSULE | Freq: Once | ORAL | Status: AC
  Administered 2015-12-20: 300 mg via ORAL
  Filled 2015-12-20: qty 2

## 2015-12-20 NOTE — ED Notes (Signed)
Medicated per order.   I&D Tray set up at bedside.

## 2015-12-20 NOTE — Discharge Instructions (Signed)
Use a warm compress on the sore area for 30 minutes at least 3 times a day. Beginning in 2 days, remove 1 inch of packing each day. There is a good possibility of a MRSA infection causing this abscess. Make sure you clean your whole body well with soap and water daily. We are prescribing a nasal antibiotic, to help prevent recurrence of infections. Take the clindamycin, to treat the facial abscess.    Abscess An abscess is an infected area that contains a collection of pus and debris.It can occur in almost any part of the body. An abscess is also known as a furuncle or boil. CAUSES  An abscess occurs when tissue gets infected. This can occur from blockage of oil or sweat glands, infection of hair follicles, or a minor injury to the skin. As the body tries to fight the infection, pus collects in the area and creates pressure under the skin. This pressure causes pain. People with weakened immune systems have difficulty fighting infections and get certain abscesses more often.  SYMPTOMS Usually an abscess develops on the skin and becomes a painful mass that is red, warm, and tender. If the abscess forms under the skin, you may feel a moveable soft area under the skin. Some abscesses break open (rupture) on their own, but most will continue to get worse without care. The infection can spread deeper into the body and eventually into the bloodstream, causing you to feel ill.  DIAGNOSIS  Your caregiver will take your medical history and perform a physical exam. A sample of fluid may also be taken from the abscess to determine what is causing your infection. TREATMENT  Your caregiver may prescribe antibiotic medicines to fight the infection. However, taking antibiotics alone usually does not cure an abscess. Your caregiver may need to make a small cut (incision) in the abscess to drain the pus. In some cases, gauze is packed into the abscess to reduce pain and to continue draining the area. HOME CARE  INSTRUCTIONS   Only take over-the-counter or prescription medicines for pain, discomfort, or fever as directed by your caregiver.  If you were prescribed antibiotics, take them as directed. Finish them even if you start to feel better.  If gauze is used, follow your caregiver's directions for changing the gauze.  To avoid spreading the infection:  Keep your draining abscess covered with a bandage.  Wash your hands well.  Do not share personal care items, towels, or whirlpools with others.  Avoid skin contact with others.  Keep your skin and clothes clean around the abscess.  Keep all follow-up appointments as directed by your caregiver. SEEK MEDICAL CARE IF:   You have increased pain, swelling, redness, fluid drainage, or bleeding.  You have muscle aches, chills, or a general ill feeling.  You have a fever. MAKE SURE YOU:   Understand these instructions.  Will watch your condition.  Will get help right away if you are not doing well or get worse.   This information is not intended to replace advice given to you by your health care provider. Make sure you discuss any questions you have with your health care provider.   Document Released: 06/25/2005 Document Revised: 03/16/2012 Document Reviewed: 11/28/2011 Elsevier Interactive Patient Education 2016 [employer]  Incision and Drainage Incision and drainage is a procedure in which a sac-like structure (cystic structure) is opened and drained. The area to be drained usually contains material such as pus, fluid, or blood.  LET YOUR CAREGIVER  KNOW ABOUT:   Allergies to medicine.  Medicines taken, including vitamins, herbs, eyedrops, over-the-counter medicines, and creams.  Use of steroids (by mouth or creams).  Previous problems with anesthetics or numbing medicines.  History of bleeding problems or blood clots.  Previous surgery.  Other health problems, including diabetes and kidney problems.  Possibility of  pregnancy, if this applies. RISKS AND COMPLICATIONS  Pain.  Bleeding.  Scarring.  Infection. BEFORE THE PROCEDURE  You may need to have an ultrasound or other imaging tests to see how large or deep your cystic structure is. Blood tests may also be used to determine if you have an infection or how severe the infection is. You may need to have a tetanus shot. PROCEDURE  The affected area is cleaned with a cleaning fluid. The cyst area will then be numbed with a medicine (local anesthetic). A small incision will be made in the cystic structure. A syringe or catheter may be used to drain the contents of the cystic structure, or the contents may be squeezed out. The area will then be flushed with a cleansing solution. After cleansing the area, it is often gently packed with a gauze or another wound dressing. Once it is packed, it will be covered with gauze and tape or some other type of wound dressing. AFTER THE PROCEDURE   Often, you will be allowed to go home right after the procedure.  You may be given antibiotic medicine to prevent or heal an infection.  If the area was packed with gauze or some other wound dressing, you will likely need to come back in 1 to 2 days to get it removed.  The area should heal in about 14 days.   This information is not intended to replace advice given to you by your health care provider. Make sure you discuss any questions you have with your health care provider.   Document Released: 03/11/2001 Document Revised: 03/16/2012 Document Reviewed: 11/10/2011 Elsevier Interactive Patient Education [employer]
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
## 2015-12-20 NOTE — ED Notes (Signed)
Pt reports left facial abscess x3 days with fever.  Pt has redness and swelling to face and scabbed area.  Pt states he feels like he has a marble on his face.

## 2016-01-27 ENCOUNTER — Encounter (HOSPITAL_COMMUNITY): Payer: Self-pay | Admitting: *Deleted

## 2016-01-27 ENCOUNTER — Emergency Department (HOSPITAL_COMMUNITY)
Admission: EM | Admit: 2016-01-27 | Discharge: 2016-01-28 | Disposition: A | Discharge status: Home / Self Care | Payer: Self-pay | Attending: Emergency Medicine | Admitting: Emergency Medicine

## 2016-01-27 DIAGNOSIS — L0201 Cutaneous abscess of face: Secondary | ICD-10-CM | POA: Insufficient documentation

## 2016-01-27 DIAGNOSIS — Z7982 Long term (current) use of aspirin: Secondary | ICD-10-CM | POA: Insufficient documentation

## 2016-01-27 DIAGNOSIS — L0291 Cutaneous abscess, unspecified: Secondary | ICD-10-CM

## 2016-01-27 DIAGNOSIS — L02415 Cutaneous abscess of right lower limb: Secondary | ICD-10-CM | POA: Insufficient documentation

## 2016-01-27 DIAGNOSIS — Z79899 Other long term (current) drug therapy: Secondary | ICD-10-CM | POA: Insufficient documentation

## 2016-01-27 MED ORDER — SULFAMETHOXAZOLE-TRIMETHOPRIM 800-160 MG PO TABS
1.0000 | ORAL_TABLET | Freq: Once | ORAL | Status: AC
  Administered 2016-01-27: 1 via ORAL
  Filled 2016-01-27: qty 1

## 2016-01-27 MED ORDER — OXYCODONE-ACETAMINOPHEN 5-325 MG PO TABS
2.0000 | ORAL_TABLET | Freq: Once | ORAL | Status: AC
  Administered 2016-01-27: 2 via ORAL
  Filled 2016-01-27: qty 2

## 2016-01-27 MED ORDER — LIDOCAINE-EPINEPHRINE (PF) 1 %-1:200000 IJ SOLN
10.0000 mL | Freq: Once | INTRAMUSCULAR | Status: DC
  Filled 2016-01-27: qty 10

## 2016-01-27 MED ORDER — LIDOCAINE-EPINEPHRINE (PF) 1 %-1:200000 IJ SOLN
INTRAMUSCULAR | Status: AC
  Filled 2016-01-27: qty 30

## 2016-01-27 MED ORDER — SULFAMETHOXAZOLE-TRIMETHOPRIM 800-160 MG PO TABS: 1.0000 | ORAL_TABLET | Freq: Two times a day (BID) | ORAL | Status: AC

## 2016-01-27 MED ORDER — IBUPROFEN 400 MG PO TABS
600.0000 mg | ORAL_TABLET | Freq: Once | ORAL | Status: AC
  Administered 2016-01-27: 600 mg via ORAL
  Filled 2016-01-27: qty 2

## 2016-01-27 MED ORDER — MUPIROCIN CALCIUM 2 % NA OINT: 1.0000 "application " | TOPICAL_OINTMENT | Freq: Two times a day (BID) | NASAL | Status: DC

## 2016-01-27 MED ORDER — OXYCODONE-ACETAMINOPHEN 5-325 MG PO TABS: 2.0000 | ORAL_TABLET | ORAL | Status: DC | PRN

## 2016-01-27 NOTE — ED Notes (Signed)
Pt c/o abscess to inner right thigh and face. Pt has multiple open sores on his face.
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
## 2016-01-27 NOTE — Discharge Instructions (Signed)
Abscess °An abscess is an infected area that contains a collection of pus and debris. It can occur in almost any part of the body. An abscess is also known as a furuncle or boil. °CAUSES  °An abscess occurs when tissue gets infected. This can occur from blockage of oil or sweat glands, infection of hair follicles, or a minor injury to the skin. As the body tries to fight the infection, pus collects in the area and creates pressure under the skin. This pressure causes pain. People with weakened immune systems have difficulty fighting infections and get certain abscesses more often.  °SYMPTOMS °Usually an abscess develops on the skin and becomes a painful mass that is red, warm, and tender. If the abscess forms under the skin, you may feel a moveable soft area under the skin. Some abscesses break open (rupture) on their own, but most will continue to get worse without care. The infection can spread deeper into the body and eventually into the bloodstream, causing you to feel ill.  °DIAGNOSIS  °Your caregiver will take your medical history and perform a physical exam. A sample of fluid may also be taken from the abscess to determine what is causing your infection. °TREATMENT  °Your caregiver may prescribe antibiotic medicines to fight the infection. However, taking antibiotics alone usually does not cure an abscess. Your caregiver may need to make a small cut (incision) in the abscess to drain the pus. In some cases, gauze is packed into the abscess to reduce pain and to continue draining the area. °HOME CARE INSTRUCTIONS  °· Only take over-the-counter or prescription medicines for pain, discomfort, or fever as directed by your caregiver. °· If you were prescribed antibiotics, take them as directed. Finish them even if you start to feel better. °· If gauze is used, follow your caregiver's directions for changing the gauze. °· To avoid spreading the infection: °· Keep your draining abscess covered with a  bandage. °· Wash your hands well. °· Do not share personal care items, towels, or whirlpools with others. °· Avoid skin contact with others. °· Keep your skin and clothes clean around the abscess. °· Keep all follow-up appointments as directed by your caregiver. °SEEK MEDICAL CARE IF:  °· You have increased pain, swelling, redness, fluid drainage, or bleeding. °· You have muscle aches, chills, or a general ill feeling. °· You have a fever. °MAKE SURE YOU:  °· Understand these instructions. °· Will watch your condition. °· Will get help right away if you are not doing well or get worse. °  °This information is not intended to replace advice given to you by your health care provider. Make sure you discuss any questions you have with your health care provider. °  °Document Released: 06/25/2005 Document Revised: 03/16/2012 Document Reviewed: 11/28/2011 °Elsevier Interactive Patient Education ©2016 [employer] ° °Incision and Drainage °Incision and drainage is a procedure in which a sac-like structure (cystic structure) is opened and drained. The area to be drained usually contains material such as pus, fluid, or blood.  °LET YOUR CAREGIVER KNOW ABOUT:  °· Allergies to medicine. °· Medicines taken, including vitamins, herbs, eyedrops, over-the-counter medicines, and creams. °· Use of steroids (by mouth or creams). °· Previous problems with anesthetics or numbing medicines. °· History of bleeding problems or blood clots. °· Previous surgery. °· Other health problems, including diabetes and kidney problems. °· Possibility of pregnancy, if this applies. °RISKS AND COMPLICATIONS °· Pain. °· Bleeding. °· Scarring. °· Infection. °BEFORE THE PROCEDURE  °  You may need to have an ultrasound or other imaging tests to see how large or deep your cystic structure is. Blood tests may also be used to determine if you have an infection or how severe the infection is. You may need to have a tetanus shot. °PROCEDURE  °The affected area  is cleaned with a cleaning fluid. The cyst area will then be numbed with a medicine (local anesthetic). A small incision will be made in the cystic structure. A syringe or catheter may be used to drain the contents of the cystic structure, or the contents may be squeezed out. The area will then be flushed with a cleansing solution. After cleansing the area, it is often gently packed with a gauze or another wound dressing. Once it is packed, it will be covered with gauze and tape or some other type of wound dressing.  °AFTER THE PROCEDURE  °· Often, you will be allowed to go home right after the procedure. °· You may be given antibiotic medicine to prevent or heal an infection. °· If the area was packed with gauze or some other wound dressing, you will likely need to come back in 1 to 2 days to get it removed. °· The area should heal in about 14 days. °  °This information is not intended to replace advice given to you by your health care provider. Make sure you discuss any questions you have with your health care provider. °  °Document Released: 03/11/2001 Document Revised: 03/16/2012 Document Reviewed: 11/10/2011 °Elsevier Interactive Patient Education ©2016 [employer] ° °

## 2016-01-28 ENCOUNTER — Emergency Department (HOSPITAL_COMMUNITY)
Admission: EM | Admit: 2016-01-28 | Discharge: 2016-01-29 | Disposition: A | Discharge status: Home / Self Care | Payer: Self-pay | Attending: Emergency Medicine | Admitting: Emergency Medicine

## 2016-01-28 ENCOUNTER — Encounter (HOSPITAL_COMMUNITY): Payer: Self-pay | Admitting: Emergency Medicine

## 2016-01-28 DIAGNOSIS — L0291 Cutaneous abscess, unspecified: Secondary | ICD-10-CM

## 2016-01-28 DIAGNOSIS — Z7982 Long term (current) use of aspirin: Secondary | ICD-10-CM | POA: Insufficient documentation

## 2016-01-28 DIAGNOSIS — L0201 Cutaneous abscess of face: Secondary | ICD-10-CM | POA: Insufficient documentation

## 2016-01-28 DIAGNOSIS — R1110 Vomiting, unspecified: Secondary | ICD-10-CM | POA: Insufficient documentation

## 2016-01-28 LAB — COMPREHENSIVE METABOLIC PANEL
ALT: 36 U/L (ref 17–63)
AST: 19 U/L (ref 15–41)
Albumin: 4.4 g/dL (ref 3.5–5.0)
Alkaline Phosphatase: 59 U/L (ref 38–126)
Anion gap: 11 (ref 5–15)
BUN: 11 mg/dL (ref 6–20)
CHLORIDE: 102 mmol/L (ref 101–111)
CO2: 26 mmol/L (ref 22–32)
Calcium: 9.2 mg/dL (ref 8.9–10.3)
Creatinine, Ser: 1.03 mg/dL (ref 0.61–1.24)
GFR calc Af Amer: >60 mL/min (ref >60)
Glucose, Bld: 112 mg/dL — ABNORMAL HIGH (ref 65–99)
POTASSIUM: 3.7 mmol/L (ref 3.5–5.1)
Sodium: 139 mmol/L (ref 135–145)
Total Bilirubin: 1 mg/dL (ref 0.3–1.2)
Total Protein: 8.3 g/dL — ABNORMAL HIGH (ref 6.5–8.1)

## 2016-01-28 LAB — CBC WITH DIFFERENTIAL/PLATELET
BASOS ABS: 0 10*3/uL (ref 0.0–0.1)
BASOS PCT: 0 %
EOS ABS: 0.2 10*3/uL (ref 0.0–0.7)
EOS PCT: 1 %
HCT: 44.9 % (ref 39.0–52.0)
Hemoglobin: 15.8 g/dL (ref 13.0–17.0)
LYMPHS PCT: 11 %
Lymphs Abs: 1.5 10*3/uL (ref 0.7–4.0)
MCH: 30.3 pg (ref 26.0–34.0)
MCHC: 35.2 g/dL (ref 30.0–36.0)
MCV: 86 fL (ref 78.0–100.0)
MONO ABS: 1.2 10*3/uL — AB (ref 0.1–1.0)
Monocytes Relative: 9 %
Neutro Abs: 11 10*3/uL — ABNORMAL HIGH (ref 1.7–7.7)
Neutrophils Relative %: 79 %
PLATELETS: 224 10*3/uL (ref 150–400)
RBC: 5.22 MIL/uL (ref 4.22–5.81)
RDW: 12.8 % (ref 11.5–15.5)
WBC: 13.9 10*3/uL — ABNORMAL HIGH (ref 4.0–10.5)

## 2016-01-28 MED ORDER — SODIUM CHLORIDE 0.9 % IV BOLUS (SEPSIS)
1000.0000 mL | Freq: Once | INTRAVENOUS | Status: AC
  Administered 2016-01-28: 1000 mL via INTRAVENOUS

## 2016-01-28 MED ORDER — VANCOMYCIN HCL IN DEXTROSE 1-5 GM/200ML-% IV SOLN
1000.0000 mg | Freq: Once | INTRAVENOUS | Status: AC
  Administered 2016-01-28: 1000 mg via INTRAVENOUS
  Filled 2016-01-28: qty 200

## 2016-01-28 MED ORDER — ONDANSETRON HCL 4 MG/2ML IJ SOLN
4.0000 mg | Freq: Once | INTRAMUSCULAR | Status: AC
  Administered 2016-01-28: 4 mg via INTRAVENOUS
  Filled 2016-01-28: qty 2

## 2016-01-28 MED ORDER — LIDOCAINE HCL (PF) 2 % IJ SOLN
INTRAMUSCULAR | Status: AC
  Administered 2016-01-29
  Filled 2016-01-28: qty 10
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
## 2016-01-28 NOTE — ED Notes (Signed)
Pt seen in ED last night for two abscesses. States one was drained, but the other was not. Pt reports increased pain and swelling to abscess on his face. Pt also states, "I have been projectile vomiting all day and unable to keep anything down."

## 2016-01-29 MED ORDER — HYDROCODONE-ACETAMINOPHEN 5-325 MG PO TABS: 1.0000 | ORAL_TABLET | Freq: Four times a day (QID) | ORAL | Status: DC | PRN

## 2016-01-29 MED ORDER — ONDANSETRON HCL 4 MG PO TABS: 4.0000 mg | ORAL_TABLET | Freq: Four times a day (QID) | ORAL | Status: DC

## 2016-01-29 NOTE — Discharge Instructions (Signed)
Follow-up in 2 or 3 days for recheck. Return sooner if problems

## 2016-01-31 LAB — WOUND CULTURE

## 2016-02-01 ENCOUNTER — Telehealth: Payer: Self-pay

## 2016-02-01 NOTE — Telephone Encounter (Signed)
Post ED Visit - Positive Culture Follow-up  Culture report reviewed by antimicrobial stewardship pharmacist:  []  Joel Benton, Pharm.D. []  Joel Benton, Pharm.D., BCPS []  Joel Benton, Pharm.D. []  Joel Benton, Pharm.D., BCPS []  Joel Benton, [address]Ph[address]D., BCPS, AAHIVP []  Joel Benton, Pharm.D., BCPS, AAHIVP [x]  Joel Benton, [address] []  Joel Benton, [address]  Positive wound culture Treated with bactrim, organism sensitive to the same and no further patient follow-up is required at this time.  Joel Benton, Joel Benton 02/01/2016, 10:54 AM

## 2016-02-04 MED FILL — Oxycodone w/ Acetaminophen Tab 5-325 MG: ORAL | Qty: 6 | Status: AC

## 2016-03-31 ENCOUNTER — Encounter (INDEPENDENT_AMBULATORY_CARE_PROVIDER_SITE_OTHER): Payer: Self-pay | Admitting: Internal Medicine

## 2016-04-22 ENCOUNTER — Other Ambulatory Visit (INDEPENDENT_AMBULATORY_CARE_PROVIDER_SITE_OTHER): Payer: Self-pay | Admitting: Internal Medicine

## 2016-04-22 ENCOUNTER — Ambulatory Visit (INDEPENDENT_AMBULATORY_CARE_PROVIDER_SITE_OTHER): Payer: Self-pay | Admitting: Internal Medicine

## 2016-04-22 ENCOUNTER — Encounter (INDEPENDENT_AMBULATORY_CARE_PROVIDER_SITE_OTHER): Payer: Self-pay | Admitting: Internal Medicine

## 2016-04-22 ENCOUNTER — Encounter (INDEPENDENT_AMBULATORY_CARE_PROVIDER_SITE_OTHER): Payer: Self-pay | Admitting: *Deleted

## 2016-04-22 ENCOUNTER — Encounter (INDEPENDENT_AMBULATORY_CARE_PROVIDER_SITE_OTHER): Payer: Self-pay

## 2016-04-22 DIAGNOSIS — I10 Essential (primary) hypertension: Secondary | ICD-10-CM | POA: Insufficient documentation

## 2016-04-22 DIAGNOSIS — K920 Hematemesis: Secondary | ICD-10-CM | POA: Insufficient documentation

## 2016-04-22 DIAGNOSIS — R110 Nausea: Secondary | ICD-10-CM
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
## 2016-04-22 NOTE — Patient Instructions (Addendum)
The risks and benefits such as perforation, bleeding, and infection were reviewed with the patient and is agreeable. Take the Dexilant 30 minutes before breakfast and take one Pepcid at bedtime.

## 2016-05-23 ENCOUNTER — Ambulatory Visit (HOSPITAL_COMMUNITY)
Admission: RE | Admit: 2016-05-23 | Discharge: 2016-05-23 | Disposition: A | Discharge status: Home / Self Care | Payer: Self-pay | Source: Ambulatory Visit | Attending: Internal Medicine | Admitting: Internal Medicine

## 2016-05-23 ENCOUNTER — Encounter (HOSPITAL_COMMUNITY): Payer: Self-pay | Admitting: *Deleted

## 2016-05-23 ENCOUNTER — Encounter (HOSPITAL_COMMUNITY)
Admission: RE | Disposition: A | Discharge status: Home / Self Care | Payer: Self-pay | Source: Ambulatory Visit | Attending: Internal Medicine

## 2016-05-23 DIAGNOSIS — K210 Gastro-esophageal reflux disease with esophagitis: Secondary | ICD-10-CM

## 2016-05-23 DIAGNOSIS — K449 Diaphragmatic hernia without obstruction or gangrene: Secondary | ICD-10-CM

## 2016-05-23 DIAGNOSIS — K228 Other specified diseases of esophagus: Secondary | ICD-10-CM

## 2016-05-23 DIAGNOSIS — K920 Hematemesis: Secondary | ICD-10-CM

## 2016-05-23 DIAGNOSIS — R12 Heartburn: Secondary | ICD-10-CM

## 2016-05-23 HISTORY — PX: ESOPHAGOGASTRODUODENOSCOPY: SHX5428

## 2016-05-23 SURGERY — EGD (ESOPHAGOGASTRODUODENOSCOPY)
Anesthesia: Moderate Sedation

## 2016-05-23 MED ORDER — SODIUM CHLORIDE 0.9% FLUSH
INTRAVENOUS | Status: AC
  Filled 2016-05-23: qty 10

## 2016-05-23 MED ORDER — MIDAZOLAM HCL 5 MG/5ML IJ SOLN
INTRAMUSCULAR | Status: DC | PRN
  Administered 2016-05-23: 2 mg via INTRAVENOUS
  Administered 2016-05-23 (×2): 3 mg via INTRAVENOUS
  Administered 2016-05-23: 2 mg via INTRAVENOUS

## 2016-05-23 MED ORDER — PROMETHAZINE HCL 25 MG/ML IJ SOLN
INTRAMUSCULAR | Status: DC | PRN
  Administered 2016-05-23 (×2): 12.5 mg via INTRAVENOUS

## 2016-05-23 MED ORDER — MEPERIDINE HCL 50 MG/ML IJ SOLN
INTRAMUSCULAR | Status: DC | PRN
  Administered 2016-05-23 (×2): 25 mg via INTRAVENOUS

## 2016-05-23 MED ORDER — SODIUM CHLORIDE 0.9 % IV SOLN
INTRAVENOUS | Status: DC
  Administered 2016-05-23: 1000 mL via INTRAVENOUS

## 2016-05-23 MED ORDER — MIDAZOLAM HCL 5 MG/5ML IJ SOLN
INTRAMUSCULAR | Status: AC
  Filled 2016-05-23: qty 10

## 2016-05-23 MED ORDER — STERILE WATER FOR IRRIGATION IR SOLN
Status: DC | PRN
  Administered 2016-05-23: 14:00:00

## 2016-05-23 MED ORDER — MEPERIDINE HCL 50 MG/ML IJ SOLN
INTRAMUSCULAR | Status: AC
  Filled 2016-05-23: qty 1

## 2016-05-23 MED ORDER — PANTOPRAZOLE SODIUM 40 MG PO TBEC: 40.0000 mg | DELAYED_RELEASE_TABLET | Freq: Every day | ORAL | 5 refills | Status: DC

## 2016-05-23 MED ORDER — PROMETHAZINE HCL 25 MG/ML IJ SOLN
INTRAMUSCULAR | Status: AC
  Filled 2016-05-23: qty 1
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
## 2016-05-23 NOTE — Discharge Instructions (Signed)
Resume usual medications and diet. Pantoprazole 40 mg by mouth 30 minutes before breakfast daily. No driving for 24 hours. Keep symptom diary as to frequency of vomiting episodes. Office visit in 3 months.  Gastrointestinal Endoscopy, Care After Refer to this sheet in the next few weeks. These instructions provide you with information on caring for yourself after your procedure. Your caregiver may also give you more specific instructions. Your treatment has been planned according to current medical practices, but problems sometimes occur. Call your caregiver if you have any problems or questions after your procedure. HOME CARE INSTRUCTIONS  If you were given medicine to help you relax (sedative), do not drive, operate machinery, or sign important documents for 24 hours.  Avoid alcohol and hot or warm beverages for the first 24 hours after the procedure.  Only take over-the-counter or prescription medicines for pain, discomfort, or fever as directed by your caregiver. You may resume taking your normal medicines unless your caregiver tells you otherwise. Ask your caregiver when you may resume taking medicines that may cause bleeding, such as aspirin, clopidogrel, or warfarin.  You may return to your normal diet and activities on the day after your procedure, or as directed by your caregiver. Walking may help to reduce any bloated feeling in your abdomen.  Drink enough fluids to keep your urine clear or pale yellow.  You may gargle with salt water if you have a sore throat. SEEK IMMEDIATE MEDICAL CARE IF:  You have severe nausea or vomiting.  You have severe abdominal pain, abdominal cramps that last longer than 6 hours, or abdominal swelling (distention).  You have severe shoulder or back pain.  You have trouble swallowing.  You have shortness of breath, your breathing is shallow, or you are breathing faster than normal.  You have a fever or a rapid heartbeat.  You vomit blood or  material that looks like coffee grounds.  You have bloody, black, or tarry stools. MAKE SURE YOU:  Understand these instructions.  Will watch your condition.  Will get help right away if you are not doing well or get worse.   This information is not intended to replace advice given to you by your health care provider. Make sure you discuss any questions you have with your health care provider.   Gastroesophageal Reflux Disease, Adult Normally, food travels down the esophagus and stays in the stomach to be digested. However, when a person has gastroesophageal reflux disease (GERD), food and stomach acid move back up into the esophagus. When this happens, the esophagus becomes sore and inflamed. Over time, GERD can create small holes (ulcers) in the lining of the esophagus.  CAUSES This condition is caused by a problem with the muscle between the esophagus and the stomach (lower esophageal sphincter, or LES). Normally, the LES muscle closes after food passes through the esophagus to the stomach. When the LES is weakened or abnormal, it does not close properly, and that allows food and stomach acid to go back up into the esophagus. The LES can be weakened by certain dietary substances, medicines, and medical conditions, including:  Tobacco use.  Pregnancy.  Having a hiatal hernia.  Heavy alcohol use.  Certain foods and beverages, such as coffee, chocolate, onions, and peppermint. RISK FACTORS This condition is more likely to develop in:  People who have an increased body weight.  People who have connective tissue disorders.  People who use NSAID medicines. SYMPTOMS Symptoms of this condition include:  Heartburn.  Difficult or  painful swallowing.  The feeling of having a lump in the throat.  Abitter taste in the mouth.  Bad breath.  Having a large amount of saliva.  Having an upset or bloated stomach.  Belching.  Chest pain.  Shortness of breath or  wheezing.  Ongoing (chronic) cough or a night-time cough.  Wearing away of tooth enamel.  Weight loss. Different conditions can cause chest pain. Make sure to see your health care provider if you experience chest pain. DIAGNOSIS Your health care provider will take a medical history and perform a physical exam. To determine if you have mild or severe GERD, your health care provider may also monitor how you respond to treatment. You may also have other tests, including:  An endoscopy toexamine your stomach and esophagus with a small camera.  A test thatmeasures the acidity level in your esophagus.  A test thatmeasures how much pressure is on your esophagus.  A barium swallow or modified barium swallow to show the shape, size, and functioning of your esophagus. TREATMENT The goal of treatment is to help relieve your symptoms and to prevent complications. Treatment for this condition may vary depending on how severe your symptoms are. Your health care provider may recommend:  Changes to your diet.  Medicine.  Surgery. HOME CARE INSTRUCTIONS Diet  Follow a diet as recommended by your health care provider. This may involve avoiding foods and drinks such as:  Coffee and tea (with or without caffeine).  Drinks that containalcohol.  Energy drinks and sports drinks.  Carbonated drinks or sodas.  Chocolate and cocoa.  Peppermint and mint flavorings.  Garlic and onions.  Horseradish.  Spicy and acidic foods, including peppers, chili powder, curry powder, vinegar, hot sauces, and barbecue sauce.  Citrus fruit juices and citrus fruits, such as oranges, lemons, and limes.  Tomato-based foods, such as red sauce, chili, salsa, and pizza with red sauce.  Fried and fatty foods, such as donuts, french fries, potato chips, and high-fat dressings.  High-fat meats, such as hot dogs and fatty cuts of red and white meats, such as rib eye steak, sausage, ham, and bacon.  High-fat  dairy items, such as whole milk, butter, and cream cheese.  Eat small, frequent meals instead of large meals.  Avoid drinking large amounts of liquid with your meals.  Avoid eating meals during the 2-3 hours before bedtime.  Avoid lying down right after you eat.  Do not exercise right after you eat. General Instructions  Pay attention to any changes in your symptoms.  Take over-the-counter and prescription medicines only as told by your health care provider. Do not take aspirin, ibuprofen, or other NSAIDs unless your health care provider told you to do so.  Do not use any tobacco products, including cigarettes, chewing tobacco, and e-cigarettes. If you need help quitting, ask your health care provider.  Wear loose-fitting clothing. Do not wear anything tight around your waist that causes pressure on your abdomen.  Raise (elevate) the head of your bed 6 inches (15cm).  Try to reduce your stress, such as with yoga or meditation. If you need help reducing stress, ask your health care provider.  If you are overweight, reduce your weight to an amount that is healthy for you. Ask your health care provider for guidance about a safe weight loss goal.  Keep all follow-up visits as told by your health care provider. This is important. SEEK MEDICAL CARE IF:  You have new symptoms.  You have unexplained weight loss.  You have difficulty swallowing, or it hurts to swallow.  You have wheezing or a persistent cough.  Your symptoms do not improve with treatment.  You have a hoarse voice. SEEK IMMEDIATE MEDICAL CARE IF:  You have pain in your arms, neck, jaw, teeth, or back.  You feel sweaty, dizzy, or light-headed.  You have chest pain or shortness of breath.  You vomit and your vomit looks like blood or coffee grounds.  You faint.  Your stool is bloody or black.  You cannot swallow, drink, or eat.   This information is not intended to replace advice given to you by your  health care provider. Make sure you discuss any questions you have with your health care provider.

## 2016-05-23 NOTE — Op Note (Signed)
[facility] Patient Name: Joel Benton Procedure Date: 05/23/2016 1:15 PM MRN: [id number] Date of Birth: [birth year] Attending MD: Lionel December , MD CSN: [id number] Age: 35 Admit Type: Outpatient Procedure:                Upper GI endoscopy Indications:              Heartburn, Hematemesis Providers:                Lionel December, MD, Loma Messing B. Patsy Lager, RN, Burke Keels, Technician Referring MD:             Ms. Leslie Andrea, FNP Medicines:                Promethazine 25 mg IV, Cetacaine spray, Meperidine                            50 mg IV, Midazolam 10 mg IV Complications:            No immediate complications. Estimated Blood Loss:     Estimated blood loss: none. Procedure:                Pre-Anesthesia Assessment:                           - Prior to the procedure, a History and Physical                            was performed, and patient medications and                            allergies were reviewed. The patient's tolerance of                            previous anesthesia was also reviewed. The risks                            and benefits of the procedure and the sedation                            options and risks were discussed with the patient.                            All questions were answered, and informed consent                            was obtained. Prior Anticoagulants: The patient                            last took aspirin 14 days prior to the procedure.                            ASA Grade Assessment: II - A patient with mild  systemic disease. After reviewing the risks and                            benefits, the patient was deemed in satisfactory                            condition to undergo the procedure.                           After obtaining informed consent, the endoscope was                            passed under direct vision. Throughout the                            procedure,  the patient's blood pressure, pulse, and                            oxygen saturations were monitored continuously. The                            [id number] ([id number] scope was introduced through the                            mouth, and advanced to the second part of duodenum.                            The upper GI endoscopy was accomplished without                            difficulty. The patient tolerated the procedure                            well. Scope In: 1:45:25 PM Scope Out: 1:50:23 PM Total Procedure Duration: 0 hours 4 minutes 58 seconds  Findings:      LA Grade A (one or more mucosal breaks less than 5 mm, not extending       between tops of 2 mucosal folds) esophagitis was found 34 cm from the       incisors.      The Z-line was irregular and was found 34 cm from the incisors.      A 2 cm hiatal hernia was present.      The entire examined stomach was normal.      The duodenal bulb and second portion of the duodenum were normal. Impression:               - LA Grade A reflux esophagitis.                           - Z-line irregular, 34 cm from the incisors.                           - 2 cm hiatal hernia.                           -  Normal stomach.                           - Normal duodenal bulb and second portion of the                            duodenum.                           - No specimens collected.                           Comment; suspect episodes of hematemesis secondary                            to Mallory-Weiss tear(none seen today). Moderate Sedation:      Moderate (conscious) sedation was administered by the endoscopy nurse       and supervised by the endoscopist. The following parameters were       monitored: oxygen saturation, heart rate, blood pressure, CO2       capnography and response to care. Total physician intraservice time was       16 minutes. Recommendation:           - Patient has a contact number available for                             emergencies. The signs and symptoms of potential                            delayed complications were discussed with the                            patient. Return to normal activities tomorrow.                            Written discharge instructions were provided to the                            patient.                           - Resume previous diet today.                           - Continue present medications.                           - Use Protonix (pantoprazole) 40 mg PO daily today.                           - Return to GI clinic in 3 months. Procedure Code(s):        --- Professional ---                           [phone], Esophagogastroduodenoscopy, flexible,  transoral; diagnostic, including collection of                            specimen(s) by brushing or washing, when performed                            (separate procedure)                           99152, Moderate sedation services provided by the                            same physician or other qualified health care                            professional performing the diagnostic or                            therapeutic service that the sedation supports,                            requiring the presence of an independent trained                            observer to assist in the monitoring of the                            patient's level of consciousness and physiological                            status; initial 15 minutes of intraservice time,                            patient age 34 years or older Diagnosis Code(s):        --- Professional ---                           K21.0, Gastro-esophageal reflux disease with                            esophagitis                           K22.8, Other specified diseases of esophagus                           K44.9, Diaphragmatic hernia without obstruction or                            gangrene                           R12, Heartburn                            K92.0, Hematemesis CPT copyright 2016 [employer]. All rights reserved. The codes  documented in this report are preliminary and upon coder review may  be revised to meet current compliance requirements. Lionel December, MD Lionel December, MD 05/23/2016 1:59:54 PM This report has been signed electronically. Number of Addenda: 0

## 2016-05-24 ENCOUNTER — Emergency Department (HOSPITAL_COMMUNITY)
Admission: EM | Admit: 2016-05-24 | Discharge: 2016-05-24 | Disposition: A | Discharge status: Home / Self Care | Payer: Self-pay | Attending: Emergency Medicine | Admitting: Emergency Medicine

## 2016-05-24 ENCOUNTER — Emergency Department (HOSPITAL_COMMUNITY): Payer: Self-pay

## 2016-05-24 ENCOUNTER — Encounter (HOSPITAL_COMMUNITY): Payer: Self-pay | Admitting: Emergency Medicine

## 2016-05-24 DIAGNOSIS — S161XXA Strain of muscle, fascia and tendon at neck level, initial encounter: Secondary | ICD-10-CM | POA: Insufficient documentation

## 2016-05-24 DIAGNOSIS — L0211 Cutaneous abscess of neck: Secondary | ICD-10-CM | POA: Insufficient documentation

## 2016-05-24 DIAGNOSIS — I10 Essential (primary) hypertension: Secondary | ICD-10-CM | POA: Insufficient documentation

## 2016-05-24 DIAGNOSIS — Z79899 Other long term (current) drug therapy: Secondary | ICD-10-CM | POA: Insufficient documentation

## 2016-05-24 DIAGNOSIS — W1789XA Other fall from one level to another, initial encounter: Secondary | ICD-10-CM | POA: Insufficient documentation

## 2016-05-24 DIAGNOSIS — Y999 Unspecified external cause status: Secondary | ICD-10-CM | POA: Insufficient documentation

## 2016-05-24 DIAGNOSIS — Y939 Activity, unspecified: Secondary | ICD-10-CM | POA: Insufficient documentation

## 2016-05-24 DIAGNOSIS — Y92828 Other wilderness area as the place of occurrence of the external cause: Secondary | ICD-10-CM | POA: Insufficient documentation

## 2016-05-24 MED ORDER — CYCLOBENZAPRINE HCL 10 MG PO TABS
10.0000 mg | ORAL_TABLET | Freq: Once | ORAL | Status: AC
  Administered 2016-05-24: 10 mg via ORAL
  Filled 2016-05-24: qty 1

## 2016-05-24 MED ORDER — IBUPROFEN 800 MG PO TABS
800.0000 mg | ORAL_TABLET | Freq: Once | ORAL | Status: AC
  Administered 2016-05-24: 800 mg via ORAL
  Filled 2016-05-24: qty 1

## 2016-05-24 MED ORDER — CYCLOBENZAPRINE HCL 10 MG PO TABS: 10.0000 mg | ORAL_TABLET | Freq: Three times a day (TID) | ORAL | 0 refills | Status: DC | PRN

## 2016-05-24 MED ORDER — OXYCODONE-ACETAMINOPHEN 5-325 MG PO TABS
1.0000 | ORAL_TABLET | Freq: Once | ORAL | Status: AC
  Administered 2016-05-24: 1 via ORAL
  Filled 2016-05-24: qty 1

## 2016-05-24 MED ORDER — IBUPROFEN 800 MG PO TABS: 800.0000 mg | ORAL_TABLET | Freq: Three times a day (TID) | ORAL | 0 refills | Status: DC

## 2016-05-24 MED ORDER — SULFAMETHOXAZOLE-TRIMETHOPRIM 800-160 MG PO TABS: 1.0000 | ORAL_TABLET | Freq: Two times a day (BID) | ORAL | 0 refills | Status: AC

## 2016-05-24 NOTE — ED Triage Notes (Signed)
Pt comes in to ED w/ c/o "Neck Spasms". Pt fell x3 days ago, per pt "feels like a knot on back of my neck". Pt AOx4.

## 2016-05-24 NOTE — ED Notes (Signed)
Returned from xray
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
## 2016-05-24 NOTE — ED Notes (Signed)
Instructed pt to take all of antibiotics as prescribed. 

## 2016-05-24 NOTE — Discharge Instructions (Signed)
Apply ice packs on/off to your neck.  Follow-up with your doctor for recheck if not improving
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
## 2016-05-26 ENCOUNTER — Encounter (INDEPENDENT_AMBULATORY_CARE_PROVIDER_SITE_OTHER): Payer: Self-pay | Admitting: Internal Medicine

## 2016-05-27 ENCOUNTER — Encounter (HOSPITAL_COMMUNITY): Payer: Self-pay | Admitting: Internal Medicine

## 2016-08-25 ENCOUNTER — Ambulatory Visit (INDEPENDENT_AMBULATORY_CARE_PROVIDER_SITE_OTHER): Payer: Self-pay | Admitting: Internal Medicine

## 2016-08-25 ENCOUNTER — Encounter (INDEPENDENT_AMBULATORY_CARE_PROVIDER_SITE_OTHER): Payer: Self-pay | Admitting: Internal Medicine

## 2016-08-25 ENCOUNTER — Encounter (INDEPENDENT_AMBULATORY_CARE_PROVIDER_SITE_OTHER): Payer: Self-pay

## 2016-11-17 ENCOUNTER — Encounter (HOSPITAL_COMMUNITY): Payer: Self-pay | Admitting: Emergency Medicine

## 2016-11-17 ENCOUNTER — Emergency Department (HOSPITAL_COMMUNITY): Payer: Self-pay

## 2016-11-17 ENCOUNTER — Emergency Department (HOSPITAL_COMMUNITY)
Admission: EM | Admit: 2016-11-17 | Discharge: 2016-11-17 | Disposition: A | Discharge status: Home / Self Care | Payer: Self-pay | Attending: Emergency Medicine | Admitting: Emergency Medicine

## 2016-11-17 DIAGNOSIS — I10 Essential (primary) hypertension: Secondary | ICD-10-CM | POA: Insufficient documentation

## 2016-11-17 DIAGNOSIS — M791 Myalgia: Secondary | ICD-10-CM | POA: Insufficient documentation

## 2016-11-17 DIAGNOSIS — Z79899 Other long term (current) drug therapy: Secondary | ICD-10-CM | POA: Insufficient documentation

## 2016-11-17 DIAGNOSIS — L03211 Cellulitis of face: Secondary | ICD-10-CM | POA: Insufficient documentation

## 2016-11-17 LAB — BASIC METABOLIC PANEL
Anion gap: 5 (ref 5–15)
BUN: 14 mg/dL (ref 6–20)
CALCIUM: 8.8 mg/dL — AB (ref 8.9–10.3)
CO2: 30 mmol/L (ref 22–32)
CREATININE: 0.8 mg/dL (ref 0.61–1.24)
Chloride: 100 mmol/L — ABNORMAL LOW (ref 101–111)
GFR calc Af Amer: >60 mL/min (ref >60)
GLUCOSE: 101 mg/dL — AB (ref 65–99)
Potassium: 3.9 mmol/L (ref 3.5–5.1)
Sodium: 135 mmol/L (ref 135–145)

## 2016-11-17 LAB — CBC WITH DIFFERENTIAL/PLATELET
Basophils Absolute: 0 10*3/uL (ref 0.0–0.1)
Basophils Relative: 0 %
EOS PCT: 2 %
Eosinophils Absolute: 0.1 10*3/uL (ref 0.0–0.7)
HEMATOCRIT: 42.8 % (ref 39.0–52.0)
Hemoglobin: 14.8 g/dL (ref 13.0–17.0)
LYMPHS PCT: 13 %
Lymphs Abs: 1.2 10*3/uL (ref 0.7–4.0)
MCH: 29.7 pg (ref 26.0–34.0)
MCHC: 34.6 g/dL (ref 30.0–36.0)
MCV: 85.9 fL (ref 78.0–100.0)
MONO ABS: 0.7 10*3/uL (ref 0.1–1.0)
MONOS PCT: 8 %
Neutro Abs: 7.2 10*3/uL (ref 1.7–7.7)
Neutrophils Relative %: 77 %
PLATELETS: 244 10*3/uL (ref 150–400)
RBC: 4.98 MIL/uL (ref 4.22–5.81)
RDW: 13.1 % (ref 11.5–15.5)
WBC: 9.2 10*3/uL (ref 4.0–10.5)

## 2016-11-17 MED ORDER — MORPHINE SULFATE (PF) 4 MG/ML IV SOLN
4.0000 mg | Freq: Once | INTRAVENOUS | Status: AC
Start: 2016-11-17 — End: 2016-11-17
  Administered 2016-11-17: 4 mg via INTRAVENOUS
  Filled 2016-11-17: qty 1

## 2016-11-17 MED ORDER — ONDANSETRON HCL 4 MG/2ML IJ SOLN
4.0000 mg | Freq: Once | INTRAMUSCULAR | Status: AC
  Administered 2016-11-17: 4 mg via INTRAVENOUS
  Filled 2016-11-17: qty 2

## 2016-11-17 MED ORDER — IOPAMIDOL (ISOVUE-300) INJECTION 61%
75.0000 mL | Freq: Once | INTRAVENOUS | Status: AC | PRN
  Administered 2016-11-17: 75 mL via INTRAVENOUS

## 2016-11-17 MED ORDER — HYDROMORPHONE HCL 1 MG/ML IJ SOLN
1.0000 mg | Freq: Once | INTRAMUSCULAR | Status: AC
  Administered 2016-11-17: 1 mg via INTRAVENOUS
  Filled 2016-11-17: qty 1

## 2016-11-17 MED ORDER — CLINDAMYCIN PHOSPHATE 900 MG/50ML IV SOLN
900.0000 mg | Freq: Once | INTRAVENOUS | Status: AC
  Administered 2016-11-17: 900 mg via INTRAVENOUS
  Filled 2016-11-17: qty 50

## 2016-11-17 MED ORDER — OXYCODONE-ACETAMINOPHEN 5-325 MG PO TABS: 1.0000 | ORAL_TABLET | Freq: Four times a day (QID) | ORAL | 0 refills | Status: DC | PRN

## 2016-11-17 MED ORDER — DOXYCYCLINE HYCLATE 100 MG PO CAPS: 100.0000 mg | ORAL_CAPSULE | Freq: Two times a day (BID) | ORAL | 0 refills | Status: DC
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
## 2016-11-17 NOTE — ED Triage Notes (Signed)
Patient c/o abscess to chin and neck. Per patient started a week ago and is progressively getting worse. Patient now reports chills and body aches. Per patient small amount of drainage from abcess on chin.

## 2016-11-17 NOTE — ED Triage Notes (Signed)
Pt with abscess to chin and neck, pt denies difficultly breathing.

## 2018-02-22 IMAGING — CT CT NECK W/ CM
3 of 4 series · 13 of 33 positions shown, 16 images · IV contrast (iopamidol)
Comparison: None.

CLINICAL DATA: Submandibular swelling and cellulitis.

EXAM:
CT NECK WITH CONTRAST
TECHNIQUE: Multidetector CT imaging of the neck was performed using the
standard protocol following the bolus administration of intravenous
contrast.
CONTRAST:  75mL DTEG8S-TKK IOPAMIDOL (DTEG8S-TKK) INJECTION 61%

[Series 6: coronal neck · coronal · 0.37mm/px · 3 of 111 slices shown]
[im 23/111  bone]
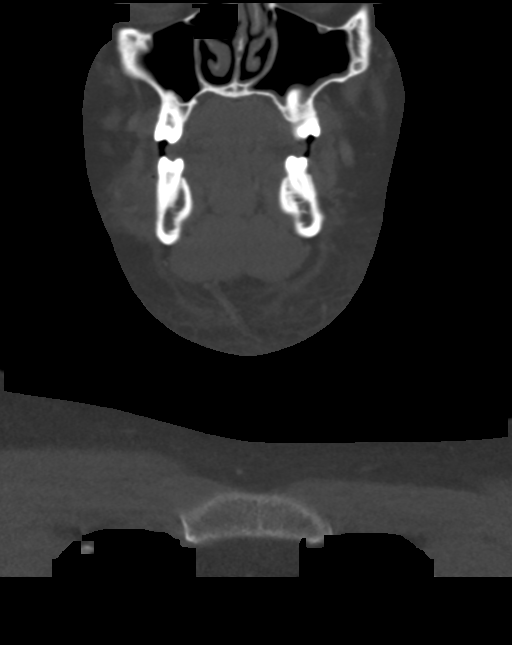
[im 45/111  bone]
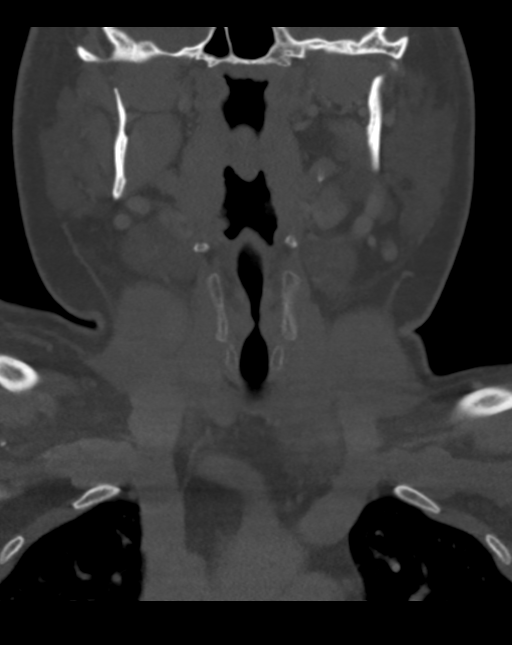
[im 67/111  bone]
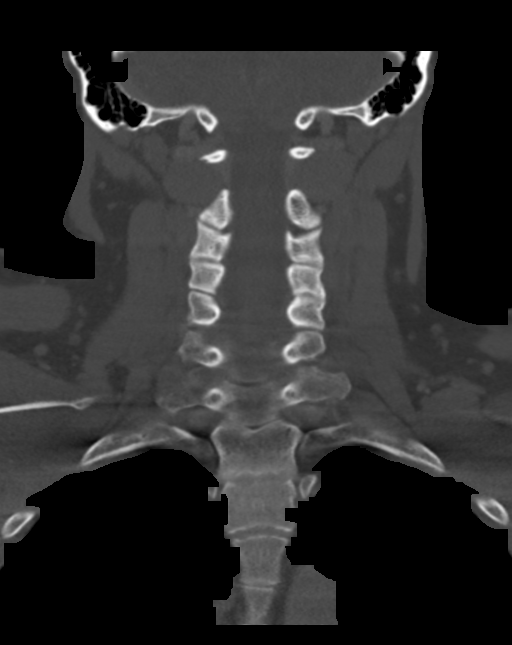

[Series 7: sagittal neck · sagittal · 0.42mm/px · 5 of 101 slices shown, 6 images]
[im 34/101  bone]
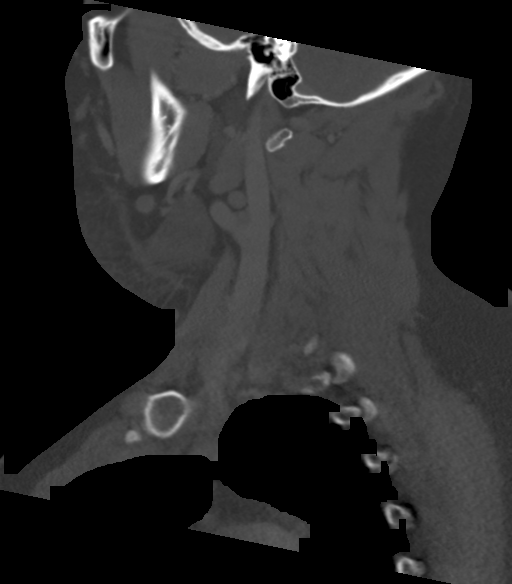
[im 42/101  bone]
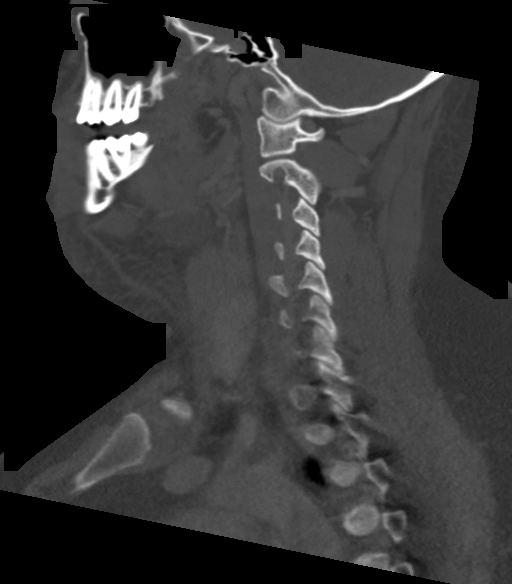
[im 51/101  soft-tissue]
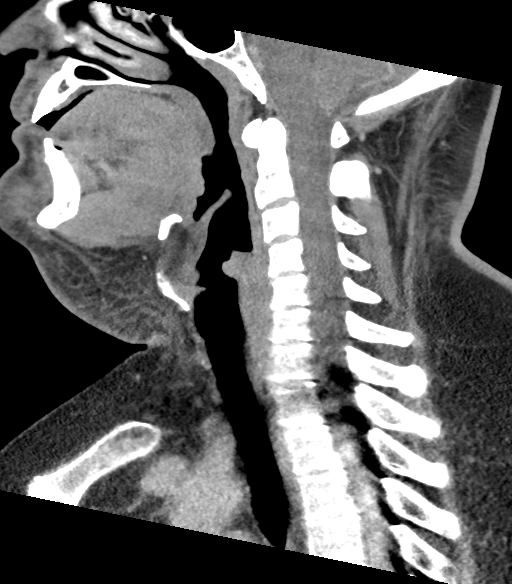
[im 51/101  bone]
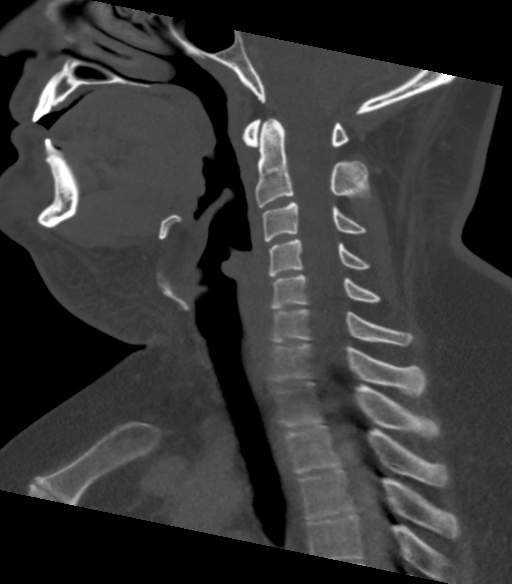
[im 59/101  bone]
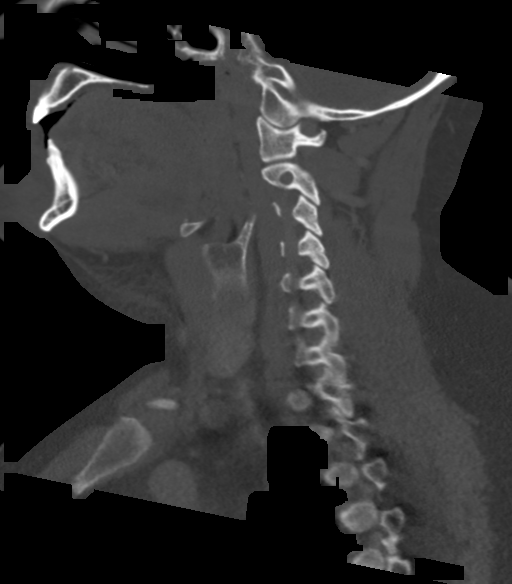
[im 67/101  bone]
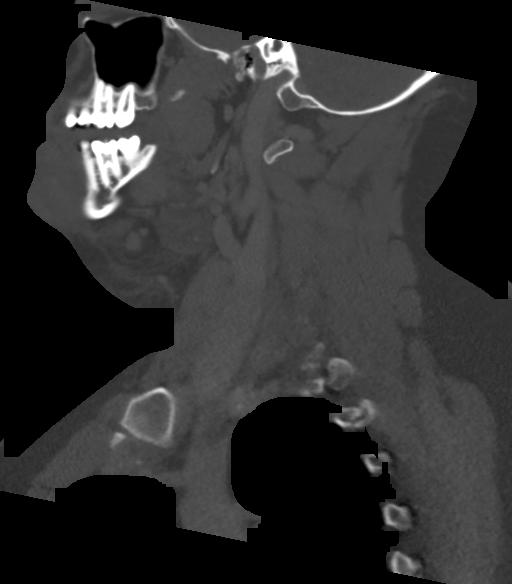

[Series 8: orthogonal ax · axial · 0.39mm/px · z∈[-94,+53]mm · 5 of 116 slices shown, 7 images]
[im 20/116  soft-tissue]
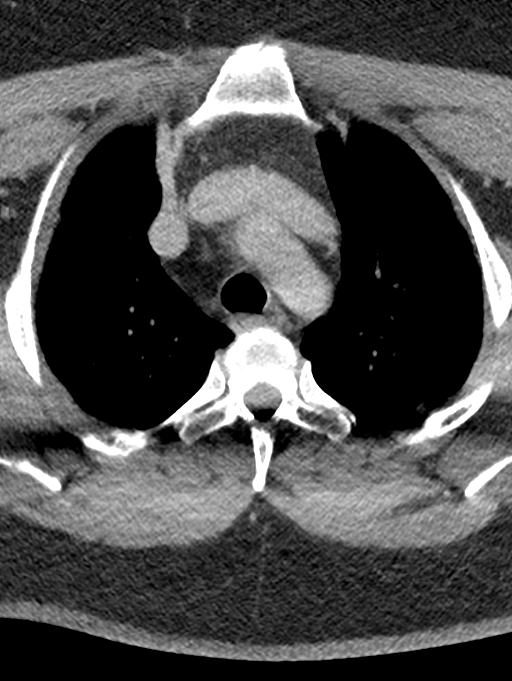
[im 20/116  bone]
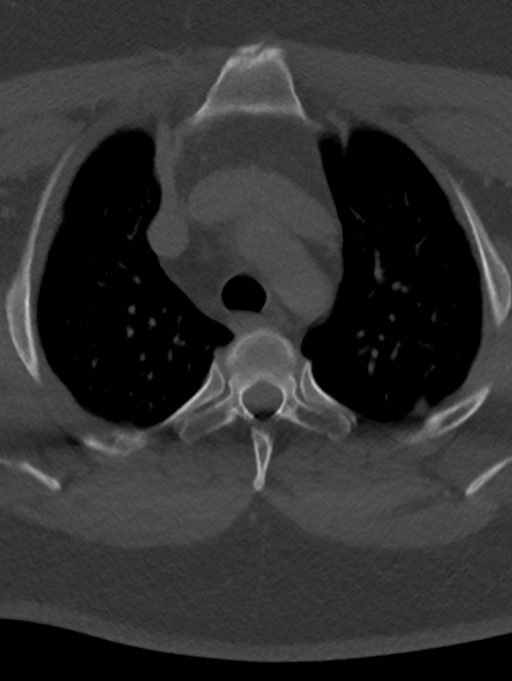
[im 39/116  bone]
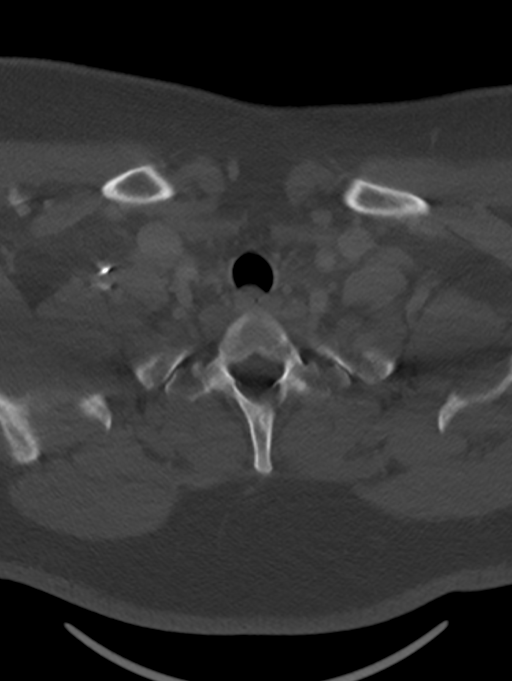
[im 58/116  bone]
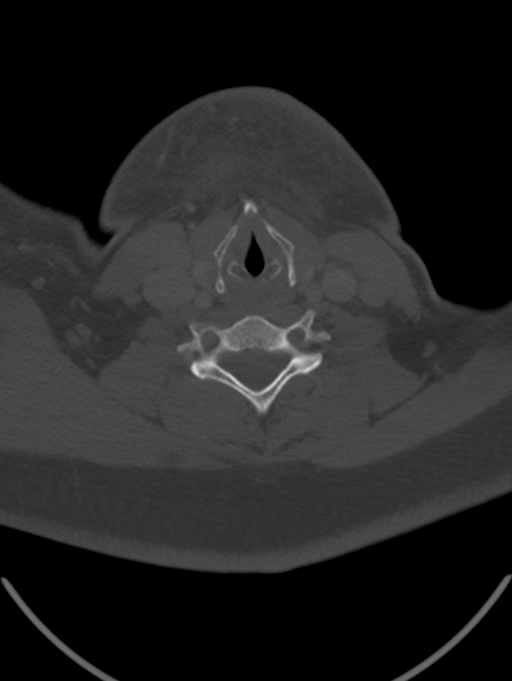
[im 77/116  bone]
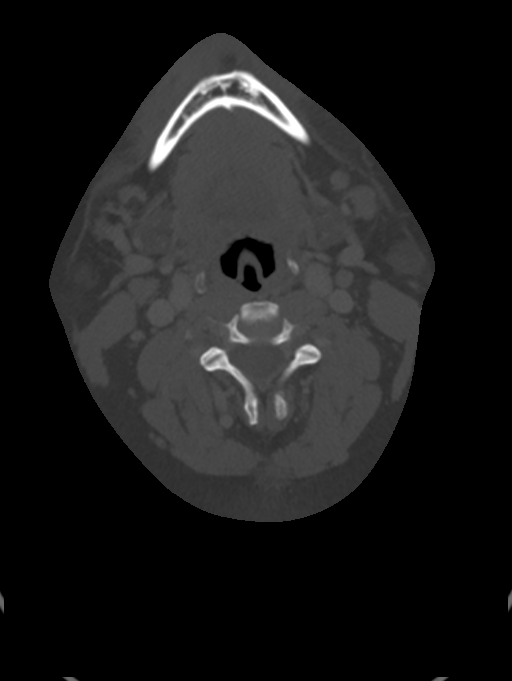
[im 96/116  soft-tissue]
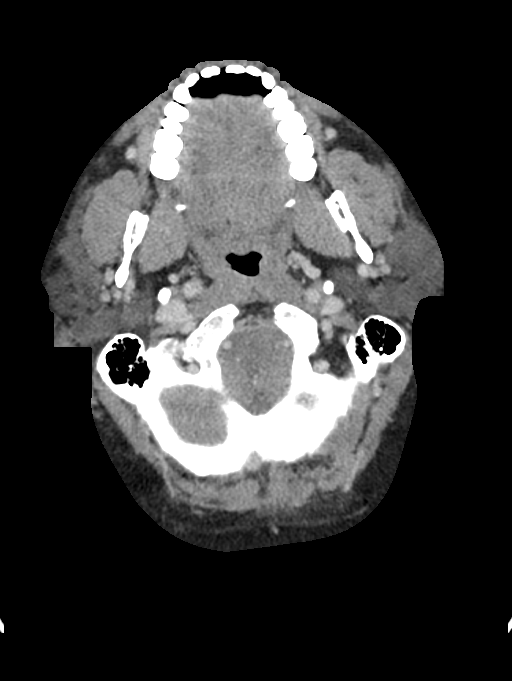
[im 96/116  bone]
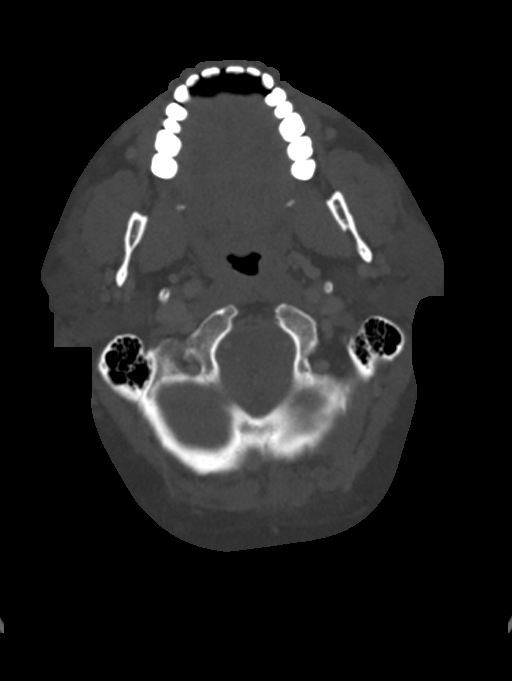

[13 of 33 positions shown; findings below may reference images not displayed]

FINDINGS: Skin thickening and subcutaneous fat reticulation and expansion over
the chin, extending to bone. No deep collection or soft tissue gas.
Just over the maximal thickening is a 5mm low density in the dermal
layer. Edema tracks into the submandibular region with platysma
thickening. No primary odontogenic source noted. There are areas of
dermal scarring in the right cheek and midline neck.

Pharynx and larynx: Normal. No mass or swelling.

Salivary glands: No primary inflammation. Negative for stone or
mass.

Thyroid: Normal.

Lymph nodes: Prominence of upper cervical chain lymph nodes,
expected in this setting.

Vascular: Negative.

Limited intracranial: Negative.

Visualized orbits: Negative.

Mastoids and visualized paranasal sinuses: Clear.

Skeleton: No acute or aggressive process.

Upper chest: Negative.
IMPRESSION: Chin cellulitis and 4 mm dermal pustule. No deep abscess or airway
inflammation.

## 2024-02-12 ENCOUNTER — Other Ambulatory Visit: Payer: Self-pay

## 2024-02-12 ENCOUNTER — Emergency Department (HOSPITAL_COMMUNITY)

## 2024-02-12 ENCOUNTER — Emergency Department (HOSPITAL_COMMUNITY)
Admission: EM | Admit: 2024-02-12 | Discharge: 2024-02-12 | Disposition: A | Discharge status: Home / Self Care | Attending: Emergency Medicine | Admitting: Emergency Medicine

## 2024-02-12 ENCOUNTER — Encounter (HOSPITAL_COMMUNITY): Payer: Self-pay

## 2024-02-12 DIAGNOSIS — K002 Abnormalities of size and form of teeth: Secondary | ICD-10-CM | POA: Diagnosis not present

## 2024-02-12 DIAGNOSIS — S3992XA Unspecified injury of lower back, initial encounter: Secondary | ICD-10-CM | POA: Diagnosis present

## 2024-02-12 DIAGNOSIS — G8929 Other chronic pain: Secondary | ICD-10-CM | POA: Insufficient documentation

## 2024-02-12 DIAGNOSIS — I10 Essential (primary) hypertension: Secondary | ICD-10-CM | POA: Insufficient documentation

## 2024-02-12 DIAGNOSIS — M546 Pain in thoracic spine: Secondary | ICD-10-CM | POA: Diagnosis not present

## 2024-02-12 DIAGNOSIS — M4317 Spondylolisthesis, lumbosacral region: Secondary | ICD-10-CM

## 2024-02-12 DIAGNOSIS — Y92410 Unspecified street and highway as the place of occurrence of the external cause: Secondary | ICD-10-CM | POA: Diagnosis not present

## 2024-02-12 DIAGNOSIS — Z79899 Other long term (current) drug therapy: Secondary | ICD-10-CM | POA: Diagnosis not present

## 2024-02-12 DIAGNOSIS — M545 Low back pain, unspecified: Secondary | ICD-10-CM

## 2024-02-12 DIAGNOSIS — R519 Headache, unspecified: Secondary | ICD-10-CM | POA: Insufficient documentation

## 2024-02-12 MED ORDER — LIDOCAINE 5 % EX PTCH: 1.0000 | MEDICATED_PATCH | Freq: Every day | CUTANEOUS | 0 refills | Status: AC | PRN

## 2024-02-12 MED ORDER — CYCLOBENZAPRINE HCL 10 MG PO TABS: 10.0000 mg | ORAL_TABLET | Freq: Two times a day (BID) | ORAL | 0 refills | Status: AC | PRN

## 2024-02-12 MED ORDER — HYDROCODONE-ACETAMINOPHEN 5-325 MG PO TABS
1.0000 | ORAL_TABLET | Freq: Once | ORAL | Status: AC
  Administered 2024-02-12: 1 via ORAL
  Filled 2024-02-12: qty 1

## 2024-02-12 MED ORDER — LIDOCAINE 5 % EX PTCH
1.0000 | MEDICATED_PATCH | Freq: Once | CUTANEOUS | Status: DC
  Administered 2024-02-12: 1 via TRANSDERMAL
  Filled 2024-02-12: qty 1

## 2024-02-12 NOTE — Discharge Instructions (Addendum)
 Please follow-up with your Primary Care Physician within the next week. Please take your medications as instructed and discuss any changes to your medications with your primary care physician. Also recommend you follow up with your spine specialist.    Based on the events which brought you to the ER today, it is possible that you may have a concussion. A concussion occurs when there is a blow to the head or body, with enough force to shake the brain and disrupt how the brain functions. You may experience symptoms such as headaches, sensitivity to light/noise, dizziness, cognitive slowing, difficulty concentrating / remembering, trouble sleeping and drowsiness. These symptoms may last anywhere from hours/days to potentially weeks/months. While these symptoms are very frustrating and perhaps debilitating, it is important that you remember that they will improve over time. Everyone has a different rate of recovery; it is difficult to predict when your symptoms will resolve. In order to allow for your brain to heal after the injury, we recommend that you see your primary physician or a physician knowledgeable in concussion management. We also advise you to let your body and brain rest: avoid physical activities (sports, gym, and exercise) and reduce cognitive demands (reading, texting, TV watching, computer use, video games, etc). School attendance, after-school activities and work may need to be modified to avoid increasing symptoms. We recommend against driving until until all symptoms have resolvedCome back to the ER right away if you are having repeated episodes of vomiting, severe/worsening headache/dizziness or any other symptom that alarms you. We recommended that someone stay with you for the next 24 hours to monitor for these worrisome symptoms.  Please return to the Emergency Department if you have any leg numbness, leg weakness, difficulty walking, fevers, worsening of pain, lightheadedness, lose  consciousness, severe abdominal pain, severe headache, difficulty urinating, or difficulty having a bowel movement. Please return to the emergency department immediately for any new or concerning symptoms, or if you get worse.
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
